# Patient Record
Sex: Female | Born: 1980 | Race: Black or African American | Hispanic: No | Marital: Single | Smoking: Never smoker
Health system: Southern US, Community
[De-identification: ages and names within clinical notes are randomized; demographics above are authoritative.]

## PROBLEM LIST (undated history)

## (undated) DIAGNOSIS — O009 Unspecified ectopic pregnancy without intrauterine pregnancy: Secondary | ICD-10-CM

## (undated) DIAGNOSIS — F419 Anxiety disorder, unspecified: Secondary | ICD-10-CM

## (undated) DIAGNOSIS — Z789 Other specified health status: Secondary | ICD-10-CM

## (undated) DIAGNOSIS — R51 Headache: Secondary | ICD-10-CM

## (undated) DIAGNOSIS — R519 Headache, unspecified: Secondary | ICD-10-CM

## (undated) HISTORY — PX: THERAPEUTIC ABORTION: SHX798

## (undated) HISTORY — PX: DILATION AND EVACUATION: SHX1459

---

## 2002-07-06 ENCOUNTER — Emergency Department (HOSPITAL_COMMUNITY): Admission: EM | Admit: 2002-07-06 | Discharge: 2002-07-06 | Payer: Self-pay | Admitting: Emergency Medicine

## 2003-01-13 ENCOUNTER — Emergency Department (HOSPITAL_COMMUNITY): Admission: EM | Admit: 2003-01-13 | Discharge: 2003-01-13 | Payer: Self-pay | Admitting: Emergency Medicine

## 2003-10-11 ENCOUNTER — Emergency Department (HOSPITAL_COMMUNITY): Admission: EM | Admit: 2003-10-11 | Discharge: 2003-10-11 | Payer: Self-pay | Admitting: Emergency Medicine

## 2004-01-27 ENCOUNTER — Emergency Department (HOSPITAL_COMMUNITY): Admission: EM | Admit: 2004-01-27 | Discharge: 2004-01-27 | Payer: Self-pay | Admitting: Emergency Medicine

## 2004-04-26 ENCOUNTER — Emergency Department (HOSPITAL_COMMUNITY): Admission: EM | Admit: 2004-04-26 | Discharge: 2004-04-26 | Payer: Self-pay | Admitting: Emergency Medicine

## 2004-11-29 ENCOUNTER — Emergency Department (HOSPITAL_COMMUNITY): Admission: EM | Admit: 2004-11-29 | Discharge: 2004-11-29 | Payer: Self-pay | Admitting: Emergency Medicine

## 2004-12-01 ENCOUNTER — Emergency Department (HOSPITAL_COMMUNITY): Admission: EM | Admit: 2004-12-01 | Discharge: 2004-12-01 | Payer: Self-pay | Admitting: Emergency Medicine

## 2005-06-01 ENCOUNTER — Emergency Department (HOSPITAL_COMMUNITY): Admission: EM | Admit: 2005-06-01 | Discharge: 2005-06-01 | Payer: Self-pay | Admitting: Family Medicine

## 2006-05-24 ENCOUNTER — Emergency Department (HOSPITAL_COMMUNITY): Admission: EM | Admit: 2006-05-24 | Discharge: 2006-05-24 | Payer: Self-pay | Admitting: Family Medicine

## 2006-05-27 ENCOUNTER — Encounter: Payer: Self-pay | Admitting: Gynecology

## 2006-05-27 ENCOUNTER — Emergency Department (HOSPITAL_COMMUNITY): Admission: EM | Admit: 2006-05-27 | Discharge: 2006-05-27 | Payer: Self-pay | Admitting: Emergency Medicine

## 2006-05-29 ENCOUNTER — Inpatient Hospital Stay (HOSPITAL_COMMUNITY): Admission: AD | Admit: 2006-05-29 | Discharge: 2006-05-29 | Payer: Self-pay | Admitting: Gynecology

## 2006-06-11 ENCOUNTER — Ambulatory Visit (HOSPITAL_COMMUNITY): Admission: RE | Admit: 2006-06-11 | Discharge: 2006-06-11 | Payer: Self-pay | Admitting: Obstetrics

## 2006-06-16 ENCOUNTER — Inpatient Hospital Stay (HOSPITAL_COMMUNITY): Admission: AD | Admit: 2006-06-16 | Discharge: 2006-06-16 | Payer: Self-pay | Admitting: Obstetrics and Gynecology

## 2006-09-21 ENCOUNTER — Inpatient Hospital Stay (HOSPITAL_COMMUNITY): Admission: AD | Admit: 2006-09-21 | Discharge: 2006-09-21 | Payer: Self-pay | Admitting: Obstetrics

## 2006-09-24 ENCOUNTER — Inpatient Hospital Stay (HOSPITAL_COMMUNITY): Admission: AD | Admit: 2006-09-24 | Discharge: 2006-09-24 | Payer: Self-pay | Admitting: Obstetrics

## 2006-09-25 ENCOUNTER — Inpatient Hospital Stay (HOSPITAL_COMMUNITY): Admission: AD | Admit: 2006-09-25 | Discharge: 2006-09-25 | Payer: Self-pay | Admitting: Obstetrics & Gynecology

## 2007-06-08 ENCOUNTER — Emergency Department (HOSPITAL_COMMUNITY): Admission: EM | Admit: 2007-06-08 | Discharge: 2007-06-08 | Payer: Self-pay | Admitting: Emergency Medicine

## 2007-07-02 ENCOUNTER — Ambulatory Visit (HOSPITAL_COMMUNITY): Admission: RE | Admit: 2007-07-02 | Discharge: 2007-07-02 | Payer: Self-pay | Admitting: Obstetrics and Gynecology

## 2008-06-30 ENCOUNTER — Emergency Department (HOSPITAL_COMMUNITY): Admission: EM | Admit: 2008-06-30 | Discharge: 2008-06-30 | Payer: Self-pay | Admitting: Emergency Medicine

## 2008-07-02 ENCOUNTER — Emergency Department (HOSPITAL_COMMUNITY): Admission: EM | Admit: 2008-07-02 | Discharge: 2008-07-02 | Payer: Self-pay | Admitting: Emergency Medicine

## 2008-12-18 ENCOUNTER — Emergency Department (HOSPITAL_COMMUNITY): Admission: EM | Admit: 2008-12-18 | Discharge: 2008-12-18 | Payer: Self-pay | Admitting: Family Medicine

## 2009-01-26 ENCOUNTER — Emergency Department (HOSPITAL_COMMUNITY): Admission: EM | Admit: 2009-01-26 | Discharge: 2009-01-26 | Payer: Self-pay | Admitting: Family Medicine

## 2009-10-25 ENCOUNTER — Emergency Department (HOSPITAL_COMMUNITY): Admission: EM | Admit: 2009-10-25 | Discharge: 2009-10-25 | Payer: Self-pay | Admitting: Emergency Medicine

## 2010-02-14 ENCOUNTER — Emergency Department (HOSPITAL_COMMUNITY): Admission: EM | Admit: 2010-02-14 | Discharge: 2010-02-14 | Payer: Self-pay | Admitting: Emergency Medicine

## 2010-02-15 ENCOUNTER — Emergency Department (HOSPITAL_COMMUNITY): Admission: EM | Admit: 2010-02-15 | Discharge: 2010-02-15 | Payer: Self-pay | Admitting: Emergency Medicine

## 2010-05-02 ENCOUNTER — Emergency Department (HOSPITAL_COMMUNITY): Admission: EM | Admit: 2010-05-02 | Discharge: 2010-05-02 | Payer: Self-pay | Admitting: Family Medicine

## 2011-03-14 LAB — URINALYSIS, ROUTINE W REFLEX MICROSCOPIC
Protein, ur: NEGATIVE mg/dL
Urobilinogen, UA: 0.2 mg/dL (ref 0.0–1.0)

## 2011-08-16 ENCOUNTER — Inpatient Hospital Stay (INDEPENDENT_AMBULATORY_CARE_PROVIDER_SITE_OTHER)
Admission: RE | Admit: 2011-08-16 | Discharge: 2011-08-16 | Disposition: A | Discharge status: Home / Self Care | Payer: 59 | Source: Ambulatory Visit | Attending: Family Medicine | Admitting: Family Medicine

## 2011-08-16 ENCOUNTER — Ambulatory Visit (INDEPENDENT_AMBULATORY_CARE_PROVIDER_SITE_OTHER): Payer: 59

## 2011-08-16 DIAGNOSIS — S92919A Unspecified fracture of unspecified toe(s), initial encounter for closed fracture: Secondary | ICD-10-CM

## 2011-09-20 LAB — URINALYSIS, ROUTINE W REFLEX MICROSCOPIC
Glucose, UA: NEGATIVE
Hgb urine dipstick: NEGATIVE
Nitrite: NEGATIVE
Specific Gravity, Urine: 1.019
pH: 6

## 2011-09-20 LAB — POCT I-STAT, CHEM 8
Glucose, Bld: 93
HCT: 46
Hemoglobin: 15.6 — ABNORMAL HIGH
Potassium: 3.8
Sodium: 136
TCO2: 21

## 2011-12-03 ENCOUNTER — Encounter: Payer: Self-pay | Admitting: Emergency Medicine

## 2011-12-03 ENCOUNTER — Emergency Department (HOSPITAL_COMMUNITY): Payer: 59

## 2011-12-03 ENCOUNTER — Emergency Department (INDEPENDENT_AMBULATORY_CARE_PROVIDER_SITE_OTHER)
Admission: EM | Admit: 2011-12-03 | Discharge: 2011-12-03 | Disposition: A | Discharge status: Nursing Home (Includes ICF) | Payer: 59 | Source: Home / Self Care

## 2011-12-03 ENCOUNTER — Emergency Department (HOSPITAL_COMMUNITY)
Admission: EM | Admit: 2011-12-03 | Discharge: 2011-12-04 | Disposition: A | Discharge status: Home / Self Care | Payer: 59 | Attending: Emergency Medicine | Admitting: Emergency Medicine

## 2011-12-03 ENCOUNTER — Encounter (HOSPITAL_COMMUNITY): Payer: Self-pay | Admitting: *Deleted

## 2011-12-03 DIAGNOSIS — R197 Diarrhea, unspecified: Secondary | ICD-10-CM | POA: Insufficient documentation

## 2011-12-03 DIAGNOSIS — R109 Unspecified abdominal pain: Secondary | ICD-10-CM | POA: Insufficient documentation

## 2011-12-03 DIAGNOSIS — K5289 Other specified noninfective gastroenteritis and colitis: Secondary | ICD-10-CM | POA: Insufficient documentation

## 2011-12-03 DIAGNOSIS — R112 Nausea with vomiting, unspecified: Secondary | ICD-10-CM | POA: Insufficient documentation

## 2011-12-03 DIAGNOSIS — R1011 Right upper quadrant pain: Secondary | ICD-10-CM

## 2011-12-03 DIAGNOSIS — I880 Nonspecific mesenteric lymphadenitis: Secondary | ICD-10-CM | POA: Insufficient documentation

## 2011-12-03 DIAGNOSIS — K529 Noninfective gastroenteritis and colitis, unspecified: Secondary | ICD-10-CM

## 2011-12-03 LAB — DIFFERENTIAL
Basophils Absolute: 0 10*3/uL (ref 0.0–0.1)
Eosinophils Absolute: 0.2 10*3/uL (ref 0.0–0.7)
Eosinophils Relative: 3 % (ref 0–5)
Lymphocytes Relative: 25 % (ref 12–46)
Neutrophils Relative %: 67 % (ref 43–77)

## 2011-12-03 LAB — POCT URINALYSIS DIP (DEVICE)
Protein, ur: NEGATIVE mg/dL
Specific Gravity, Urine: 1.025 (ref 1.005–1.030)

## 2011-12-03 LAB — COMPREHENSIVE METABOLIC PANEL
CO2: 25 mEq/L (ref 19–32)
Calcium: 9 mg/dL (ref 8.4–10.5)
Creatinine, Ser: 0.63 mg/dL (ref 0.50–1.10)
GFR calc Af Amer: >90 mL/min (ref >90)
GFR calc non Af Amer: >90 mL/min (ref >90)
Glucose, Bld: 80 mg/dL (ref 70–99)

## 2011-12-03 LAB — CBC
MCV: 89.4 fL (ref 78.0–100.0)
WBC: 6.9 10*3/uL (ref 4.0–10.5)

## 2011-12-03 LAB — POCT PREGNANCY, URINE: Preg Test, Ur: NEGATIVE

## 2011-12-03 MED ORDER — MORPHINE SULFATE 4 MG/ML IJ SOLN
4.0000 mg | Freq: Once | INTRAMUSCULAR | Status: AC
  Administered 2011-12-04: 4 mg via INTRAVENOUS
  Filled 2011-12-03: qty 1

## 2011-12-03 MED ORDER — SODIUM CHLORIDE 0.9 % IV BOLUS (SEPSIS)
1000.0000 mL | Freq: Once | INTRAVENOUS | Status: AC
  Administered 2011-12-03: 1000 mL via INTRAVENOUS

## 2011-12-03 MED ORDER — MORPHINE SULFATE 4 MG/ML IJ SOLN
4.0000 mg | Freq: Once | INTRAMUSCULAR | Status: AC
  Administered 2011-12-03: 4 mg via INTRAVENOUS
  Filled 2011-12-03: qty 1

## 2011-12-03 MED ORDER — SODIUM CHLORIDE 0.9 % IV SOLN: Freq: Once | INTRAVENOUS | Status: DC

## 2011-12-03 MED ORDER — ONDANSETRON HCL 4 MG/2ML IJ SOLN
4.0000 mg | Freq: Once | INTRAMUSCULAR | Status: AC
  Administered 2011-12-03: 4 mg via INTRAVENOUS
  Filled 2011-12-03: qty 2

## 2011-12-03 NOTE — ED Provider Notes (Signed)
History     CSN: [id number] Arrival date & time: 12/03/2011  2:03 PM   None     Chief Complaint  Patient presents with  . Abdominal Pain  . Back Pain    (Consider location/radiation/quality/duration/timing/severity/associated sxs/prior treatment) HPI Comments: Onset of RUQ abd pain 2 days ago. Pain is worse with movement. Has decreased appetite, nausea and has vomited. Has vomited 3 times since onset of pain. Loose greenish colored stool 2 nights ago. No fever or chills. Pain radiates to her Rt mid back.   Patient is a 30 y.o. female presenting with abdominal pain. The history is provided by the patient.  Abdominal Pain The primary symptoms of the illness include abdominal pain, fever, nausea, vomiting and diarrhea. The primary symptoms of the illness do not include shortness of breath, dysuria, vaginal discharge or vaginal bleeding. The current episode started 2 days ago. The onset of the illness was sudden.  The abdominal pain began 2 days ago. The pain came on suddenly. The abdominal pain has been unchanged since its onset. The abdominal pain is located in the RUQ. The abdominal pain radiates to the back. The severity of the abdominal pain is 7/10. The abdominal pain is relieved by nothing. The abdominal pain is exacerbated by certain positions.  Nausea began 2 days ago. The nausea is exacerbated by food.  The vomiting began 2 days ago. Vomiting occurs 2 to 5 times per day.  The patient states that she believes she is currently not pregnant. The patient has had a change in bowel habit. Symptoms associated with the illness do not include chills, heartburn, constipation, urgency or frequency.    History reviewed. No pertinent past medical history.  History reviewed. No pertinent past surgical history.  History reviewed. No pertinent family history.  History  Substance Use Topics  . Smoking status: Never Smoker   . Smokeless tobacco: Not on file  . Alcohol Use: No    OB  History    Grav Para Term Preterm Abortions TAB SAB Ect Mult Living                  Review of Systems  Constitutional: Positive for fever. Negative for chills.  Respiratory: Negative for shortness of breath.   Cardiovascular: Negative for chest pain.  Gastrointestinal: Positive for nausea, vomiting, abdominal pain and diarrhea. Negative for heartburn and constipation.  Genitourinary: Negative for dysuria, urgency, frequency, vaginal bleeding and vaginal discharge.    Allergies  Review of patient's allergies indicates no known allergies.  Home Medications   Current Outpatient Rx  Name Route Sig Dispense Refill  . AMITRIPTYLINE HCL 10 MG PO TABS Oral Take 10 mg by mouth at bedtime.      Marland Kitchen ZOLPIDEM TARTRATE 10 MG PO TABS Oral Take 10 mg by mouth at bedtime as needed.        BP 120/81  Pulse 126  Temp(Src) 98.9 F (37.2 C) (Oral)  Resp 20  Wt 49 lb 12 oz (22.566 kg)  SpO2 100%  LMP 11/19/2011  Physical Exam  Nursing note and vitals reviewed. Constitutional: She appears well-developed and well-nourished. No distress.  HENT:  Head: Normocephalic and atraumatic.  Right Ear: Tympanic membrane, external ear and ear canal normal.  Left Ear: Tympanic membrane, external ear and ear canal normal.  Nose: Nose normal.  Mouth/Throat: Uvula is midline, oropharynx is clear and moist and mucous membranes are normal. No oropharyngeal exudate, posterior oropharyngeal edema or posterior oropharyngeal erythema.  Neck: Neck supple.  Cardiovascular: Normal rate, regular rhythm and normal heart sounds.   Pulmonary/Chest: Effort normal and breath sounds normal. No respiratory distress.  Abdominal: Normal appearance and bowel sounds are normal. She exhibits no mass. There is no hepatosplenomegaly. There is tenderness in the right upper quadrant. There is guarding. There is no rebound.  Lymphadenopathy:    She has no cervical adenopathy.  Neurological: She is alert.  Skin: Skin is warm and dry.   Psychiatric: She has a normal mood and affect.    ED Course  Procedures (including critical care time)  Labs Reviewed  POCT URINALYSIS DIP (DEVICE) - Abnormal; Notable for the following:    Bilirubin Urine SMALL (*)    Urobilinogen, UA 4.0 (*)    Leukocytes, UA TRACE (*) Biochemical Testing Only. Please order routine urinalysis from main lab if confirmatory testing is needed.   All other components within normal limits  POCT PREGNANCY, URINE  POCT URINALYSIS DIPSTICK  POCT PREGNANCY, URINE   No results found.   No diagnosis found.    MDM          Melody Comas, PA 12/03/11 [phone]

## 2011-12-03 NOTE — ED Notes (Signed)
The pt was sent here from [facility] with epigastric pain for 3 days.  Vomiting for 3 days

## 2011-12-03 NOTE — ED Provider Notes (Signed)
8:00 PM Pt in the CDU, care transferred from Dr Lynelle Doctor. She has had right-sided abdominal pain for 3 days with nausea and vomiting for the first 2 days. There's been no fever or chills. She was sent to the emergency department from urgent care with a suspicion for cholecystitis she is awaiting an abdominal ultrasound. She reports improved pain after medication and on exam has moderate tenderness to palpation of the entire abdomen, worse on the right side.   US Abdomen Complete  12/03/2011  *RADIOLOGY REPORT*  Clinical Data: Right upper quadrant pain  ABDOMEN ULTRASOUND  Technique:  Complete abdominal ultrasound examination was performed including evaluation of the liver, gallbladder, bile ducts, pancreas, kidneys, spleen, IVC, and abdominal aorta.  Comparison: No comparison studies available.  Findings:  Gallbladder:  There is no evidence for gallstones.  No gallbladder wall thickening or pericholecystic fluid.  The sonographer reports no sonographic Murphy's sign.  Common Bile Duct:  Nondilated at 3 mm diameter.  Liver:  Normal.  No focal parenchymal abnormality.  No biliary dilation.  IVC:  Normal.  Pancreas:  Normal.  Spleen:  Normal.  Right kidney:  10.9 cm in long axis.  Normal.  Left kidney:  11.0 cm in long axis.  Normal.  Abdominal Aorta:  No aneurysm.  IMPRESSION: Normal abdominal ultrasound.  Original Report Authenticated By: ERIC A. MANSELL, M.D.      11:15 PM I have discussed with the patient the results of her ultrasound, which is negative for any acute findings. Her labs show no significant abnormalities. However, she does complain of significant continued pain to the right side of her abdomen, which she describes as sharp and intermittent. We discussed the possibility of appendicitis or colitis causing her symptoms and she is agreeable with remaining in the emergency department for continued testing that will include a CT of the abdomen and pelvis with contrast. I have ordered her  additional pain medication.   11:48 PM I have discussed the patient's plan of care with Dr. Norlene Campbell who will assume care overnight.  Elwyn Reach [place], [place] 12/03/11 [phone]

## 2011-12-03 NOTE — ED Notes (Signed)
Pt to Ultrasound at this time.

## 2011-12-03 NOTE — ED Notes (Signed)
Pt here with right upper quad abd pain radiating around side and right back that started x 3 dys ago with vomiting and poor appetite.last emesis last night after dinner.no fever or cold sx reported.lmp 11/19/11.pt tried prescribed muscle relaxers by pcp metaxalone but no improvement.sx constant sharp,achy pain

## 2011-12-03 NOTE — ED Provider Notes (Addendum)
History     CSN: [id number] Arrival date & time: 12/03/2011  5:16 PM   First MD Initiated Contact with Patient 12/03/11 1844      Chief Complaint  Patient presents with  . Abdominal Pain    (Consider location/radiation/quality/duration/timing/severity/associated sxs/prior treatment) HPI  Patient relates she started having right upper quadrant constant abdominal pain 3 days ago. She's had nausea and vomiting, stating her last episode of vomiting was last night about 10 PM. She had diarrhea 2 nights ago but not today. She states the pains in her right upper quadrant and radiates into her right back. She denies feeling dizzy or weak on standing. She states laying down or movement or deep breathing makes the pain worse. Nothing makes it feel better. She relates she thought at first it was a sore muscle but Robaxin has not been helping. She states the pain is sharp.  Primary care physician Dr. Concepcion Elk  History reviewed. No pertinent past medical history. Migraines  History reviewed. No pertinent past surgical history.  No family history on file.  No family history gallstones  History  Substance Use Topics  . Smoking status: Never Smoker   . Smokeless tobacco: Not on file  . Alcohol Use: No   patient employed  OB History    Grav Para Term Preterm Abortions TAB SAB Ect Mult Living                  Review of Systems  All other systems reviewed and are negative.    Allergies  Penicillins  Home Medications   Current Outpatient Rx  Name Route Sig Dispense Refill  . AMITRIPTYLINE HCL 10 MG PO TABS Oral Take 10 mg by mouth at bedtime.     Marland Kitchen METAXALONE 800 MG PO TABS Oral Take 800 mg by mouth 3 (three) times daily. For pain     . ZOLPIDEM TARTRATE 10 MG PO TABS Oral Take 10 mg by mouth at bedtime as needed.       BP 114/70  Pulse 87  Temp(Src) 98.2 F (36.8 C) (Oral)  Resp 18  SpO2 100%  LMP 11/19/2011 Vital signs normal  Physical Exam  Nursing note and vitals  reviewed. Constitutional: She is oriented to person, place, and time. She appears well-developed and well-nourished.  Non-toxic appearance. She does not appear ill. No distress.       Patient upset about her wait  HENT:  Head: Normocephalic and atraumatic.  Right Ear: External ear normal.  Left Ear: External ear normal.  Nose: Nose normal. No mucosal edema or rhinorrhea.  Mouth/Throat: Oropharynx is clear and moist and mucous membranes are normal. No dental abscesses or uvula swelling.  Eyes: Conjunctivae and EOM are normal. Pupils are equal, round, and reactive to light.  Neck: Normal range of motion and full passive range of motion without pain. Neck supple.  Cardiovascular: Normal rate, regular rhythm and normal heart sounds.  Exam reveals no gallop and no friction rub.   No murmur heard. Pulmonary/Chest: Effort normal and breath sounds normal. No respiratory distress. She has no wheezes. She has no rhonchi. She has no rales. She exhibits no tenderness and no crepitus.  Abdominal: Soft. Normal appearance and bowel sounds are normal. She exhibits no distension. There is tenderness. There is no rebound and no guarding.       Patient's tender in the right upper quadrant which is also tender in the right lower quadrant. There is no guarding or rebound. There is no pain  to tapping the right knee. There is also no psoas sign  Musculoskeletal: Normal range of motion. She exhibits no edema and no tenderness.       Moves all extremities well.   Neurological: She is alert and oriented to person, place, and time. She has normal strength. No cranial nerve deficit.  Skin: Skin is warm, dry and intact. No rash noted. No erythema. No pallor.  Psychiatric: Her speech is normal and behavior is normal. Her mood appears not anxious.       Affect is flat    ED Course  Procedures (including critical care time)  Patient started on IV fluids IV pain and nausea medicine  1918 Discussed with  Lorenz Coaster,  PA will send to CDU to get her labs and ultrasound done, if negative may need CT scan to r/o appy or colitis  Results for orders placed during the hospital encounter of 12/03/11  CBC      Component Value Range   WBC 6.9  4.0 - 10.5 (K/uL)   RBC 4.54  3.87 - 5.11 (MIL/uL)   Hemoglobin 14.1  12.0 - 15.0 (g/dL)   HCT 16.1  09.6 - 04.5 (%)   MCV 89.4  78.0 - 100.0 (fL)   MCH 31.1  26.0 - 34.0 (pg)   MCHC 34.7  30.0 - 36.0 (g/dL)   RDW 40.9  81.1 - 91.4 (%)   Platelets 236  150 - 400 (K/uL)  DIFFERENTIAL      Component Value Range   Neutrophils Relative 67  43 - 77 (%)   Neutro Abs 4.6  1.7 - 7.7 (K/uL)   Lymphocytes Relative 25  12 - 46 (%)   Lymphs Abs 1.7  0.7 - 4.0 (K/uL)   Monocytes Relative 5  3 - 12 (%)   Monocytes Absolute 0.4  0.1 - 1.0 (K/uL)   Eosinophils Relative 3  0 - 5 (%)   Eosinophils Absolute 0.2  0.0 - 0.7 (K/uL)   Basophils Relative 0  0 - 1 (%)   Basophils Absolute 0.0  0.0 - 0.1 (K/uL)  COMPREHENSIVE METABOLIC PANEL      Component Value Range   Sodium 139  135 - 145 (mEq/L)   Potassium 3.5  3.5 - 5.1 (mEq/L)   Chloride 103  96 - 112 (mEq/L)   CO2 25  19 - 32 (mEq/L)   Glucose, Bld 80  70 - 99 (mg/dL)   BUN 11  6 - 23 (mg/dL)   Creatinine, Ser 7.82  0.50 - 1.10 (mg/dL)   Calcium 9.0  8.4 - 95.6 (mg/dL)   Total Protein 7.0  6.0 - 8.3 (g/dL)   Albumin 3.4 (*) 3.5 - 5.2 (g/dL)   AST 11  0 - 37 (U/L)   ALT 8  0 - 35 (U/L)   Alkaline Phosphatase 57  39 - 117 (U/L)   Total Bilirubin 0.4  0.3 - 1.2 (mg/dL)   GFR calc non Af Amer >90  >90 (mL/min)   GFR calc Af Amer >90  >90 (mL/min)  LIPASE, BLOOD      Component Value Range   Lipase 24  11 - 59 (U/L)   .Laboratory Interpretation normal  US abdomen pending  Initial Impression Abdominal pain vomiting  MDM          Ward Givens, MD 12/03/11 2201  Patient seen at change of shift after stay in CDU. Negative ultrasound and CT scan aside from mesenteric adenitis and possible gastroenteritis.  Patient  updated on findings. Will send home with pain and nausea medicine. She is happy with this plan  Olivia Mackie, MD 12/04/11 [phone]

## 2011-12-04 ENCOUNTER — Encounter (HOSPITAL_COMMUNITY): Payer: Self-pay | Admitting: Radiology

## 2011-12-04 ENCOUNTER — Emergency Department (HOSPITAL_COMMUNITY): Payer: 59

## 2011-12-04 MED ORDER — IOHEXOL 300 MG/ML  SOLN
40.0000 mL | Freq: Once | INTRAMUSCULAR | Status: AC | PRN
  Administered 2011-12-03: 40 mL via ORAL

## 2011-12-04 MED ORDER — PROMETHAZINE HCL 25 MG PO TABS: 25.0000 mg | ORAL_TABLET | Freq: Four times a day (QID) | ORAL | Status: AC | PRN

## 2011-12-04 MED ORDER — PROMETHAZINE HCL 25 MG RE SUPP: 25.0000 mg | Freq: Four times a day (QID) | RECTAL | Status: AC | PRN

## 2011-12-04 MED ORDER — IOHEXOL 300 MG/ML  SOLN
100.0000 mL | Freq: Once | INTRAMUSCULAR | Status: AC | PRN
  Administered 2011-12-04: 100 mL via INTRAVENOUS

## 2011-12-04 MED ORDER — ONDANSETRON HCL 4 MG PO TABS: 4.0000 mg | ORAL_TABLET | Freq: Four times a day (QID) | ORAL | Status: AC

## 2011-12-04 MED ORDER — OXYCODONE-ACETAMINOPHEN 5-325 MG PO TABS: 2.0000 | ORAL_TABLET | ORAL | Status: AC | PRN

## 2011-12-04 NOTE — ED Notes (Signed)
Pt to CT at this time.

## 2011-12-04 NOTE — ED Notes (Signed)
Pt finished drinking contrast, CT aware.  Pt resting with eyes closed.

## 2011-12-04 NOTE — ED Notes (Signed)
Pt given pain med.  Drinking contrast for CT.

## 2011-12-04 NOTE — ED Provider Notes (Signed)
See CDU note  Ward Givens, MD 12/04/11 2211

## 2011-12-04 NOTE — ED Provider Notes (Signed)
History     CSN: [id number] Arrival date & time: 12/03/2011  5:16 PM   First MD Initiated Contact with Patient 12/03/11 1844      Chief Complaint  Patient presents with  . Abdominal Pain    (Consider location/radiation/quality/duration/timing/severity/associated sxs/prior treatment) HPI  History reviewed. No pertinent past medical history.  History reviewed. No pertinent past surgical history.  No family history on file.  History  Substance Use Topics  . Smoking status: Never Smoker   . Smokeless tobacco: Not on file  . Alcohol Use: No    OB History    Grav Para Term Preterm Abortions TAB SAB Ect Mult Living                  Review of Systems  Allergies  Penicillins  Home Medications   Current Outpatient Rx  Name Route Sig Dispense Refill  . AMITRIPTYLINE HCL 10 MG PO TABS Oral Take 10 mg by mouth at bedtime.     Marland Kitchen METAXALONE 800 MG PO TABS Oral Take 800 mg by mouth 3 (three) times daily. For pain     . ZOLPIDEM TARTRATE 10 MG PO TABS Oral Take 10 mg by mouth at bedtime as needed.     Marland Kitchen ONDANSETRON HCL 4 MG PO TABS Oral Take 1 tablet (4 mg total) by mouth every 6 (six) hours. 12 tablet 0  . OXYCODONE-ACETAMINOPHEN 5-325 MG PO TABS Oral Take 2 tablets by mouth every 4 (four) hours as needed for pain. 20 tablet 0  . PROMETHAZINE HCL 25 MG RE SUPP Rectal Place 1 suppository (25 mg total) rectally every 6 (six) hours as needed for nausea. 12 each 0  . PROMETHAZINE HCL 25 MG PO TABS Oral Take 1 tablet (25 mg total) by mouth every 6 (six) hours as needed for nausea. 30 tablet 0    BP 114/70  Pulse 87  Temp(Src) 98.2 F (36.8 C) (Oral)  Resp 18  SpO2 100%  LMP 11/19/2011  Physical Exam  ED Course  Procedures (including critical care time)  Labs Reviewed  COMPREHENSIVE METABOLIC PANEL - Abnormal; Notable for the following:    Albumin 3.4 (*)    All other components within normal limits  CBC  DIFFERENTIAL  LIPASE, BLOOD   US Abdomen  Complete  12/03/2011  *RADIOLOGY REPORT*  Clinical Data: Right upper quadrant pain  ABDOMEN ULTRASOUND  Technique:  Complete abdominal ultrasound examination was performed including evaluation of the liver, gallbladder, bile ducts, pancreas, kidneys, spleen, IVC, and abdominal aorta.  Comparison: No comparison studies available.  Findings:  Gallbladder:  There is no evidence for gallstones.  No gallbladder wall thickening or pericholecystic fluid.  The sonographer reports no sonographic Murphy's sign.  Common Bile Duct:  Nondilated at 3 mm diameter.  Liver:  Normal.  No focal parenchymal abnormality.  No biliary dilation.  IVC:  Normal.  Pancreas:  Normal.  Spleen:  Normal.  Right kidney:  10.9 cm in long axis.  Normal.  Left kidney:  11.0 cm in long axis.  Normal.  Abdominal Aorta:  No aneurysm.  IMPRESSION: Normal abdominal ultrasound.  Original Report Authenticated By: ERIC A. MANSELL, M.D.   Ct Abdomen Pelvis W Contrast  12/04/2011  *RADIOLOGY REPORT*  Clinical Data: Right-sided abdominal pain, nausea, vomiting, diarrhea  CT ABDOMEN AND PELVIS WITH CONTRAST  Technique:  Multidetector CT imaging of the abdomen and pelvis was performed following the standard protocol during bolus administration of intravenous contrast.  Contrast: OMNIPAQUE IOHEXOL 300 MG/ML  IV SOLN  Comparison: None.  Findings: Small right pleural effusion with mild atelectasis in the right lung base.  The liver, spleen, gallbladder, pancreas, adrenal glands, kidneys, abdominal aorta, and retroperitoneal lymph nodes are unremarkable.  Diffusely prominent mesenteric lymph nodes without pathologic enlargement, likely reactive.  The stomach is decompressed.  Normal caliber small bowel with contrast material extending to the colon suggesting no evidence of obstruction. There is wall thickening and fold thickening of the distal small bowel suggesting gastroenteritis or inflammatory bowel disease.  No free fluid or free air in the abdomen.   Diffusely stool filled colon without wall thickening or distension.  Pelvis:  Uterus and adnexal structures are not significantly enlarged.  Suggestion of a cyst in the right ovary.  No free or loculated pelvic fluid collections.  The bladder wall is not thickened.  The appendix is not specifically demonstrated.  . Normal alignment of the lumbar vertebrae.  IMPRESSION: Diffuse thickening of the wall and folds in the distal small bowel with reactive mesenteric lymphadenopathy.  Changes could represent gastroenteritis or inflammatory bowel disease.  Appendix is not visualized but no specific inflammatory changes are demonstrated in the right lower quadrant.  Original Report Authenticated By: Marlon Pel, M.D.     1. Mesenteric adenitis   2. Abdominal pain   3. Gastroenteritis       MDM  Abdominal pain        Olivia Mackie, MD 12/04/11 [phone]

## 2011-12-05 NOTE — ED Provider Notes (Signed)
Medical screening examination/treatment/procedure(s) were performed by non-physician practitioner and as supervising physician I was immediately available for consultation/collaboration.   Barkley Bruns MD.    Barkley Bruns, MD 12/05/11 2135

## 2012-10-07 ENCOUNTER — Other Ambulatory Visit: Payer: Self-pay | Admitting: Internal Medicine

## 2012-10-07 DIAGNOSIS — G43909 Migraine, unspecified, not intractable, without status migrainosus: Secondary | ICD-10-CM

## 2012-10-10 ENCOUNTER — Other Ambulatory Visit: Payer: 59

## 2012-10-13 ENCOUNTER — Other Ambulatory Visit: Payer: 59

## 2013-01-02 ENCOUNTER — Emergency Department (INDEPENDENT_AMBULATORY_CARE_PROVIDER_SITE_OTHER): Payer: 59

## 2013-01-02 ENCOUNTER — Encounter (HOSPITAL_COMMUNITY): Payer: Self-pay | Admitting: Emergency Medicine

## 2013-01-02 ENCOUNTER — Emergency Department (INDEPENDENT_AMBULATORY_CARE_PROVIDER_SITE_OTHER)
Admission: EM | Admit: 2013-01-02 | Discharge: 2013-01-02 | Disposition: A | Discharge status: Home / Self Care | Payer: 59 | Source: Home / Self Care | Attending: Family Medicine | Admitting: Family Medicine

## 2013-01-02 DIAGNOSIS — R071 Chest pain on breathing: Secondary | ICD-10-CM

## 2013-01-02 DIAGNOSIS — R0789 Other chest pain: Secondary | ICD-10-CM

## 2013-01-02 MED ORDER — DICLOFENAC 18 MG PO CAPS: 18.0000 mg | ORAL_CAPSULE | Freq: Three times a day (TID) | ORAL | Status: DC

## 2013-01-02 NOTE — ED Notes (Addendum)
Was asked by front staff to evaluate pt's chest pain Pt c/o chest pain x2 months and it increases w/activity/pressure and when she takes deep breaths Describes pain as constant and tight on right side Denies: inj/trauma, prior cold sx, blurry vision, edema, nauseas, diarrhea, fever  She is alert w/no signs of acute distress; pt asked to notify front staff if sx change while she waits

## 2013-01-02 NOTE — ED Provider Notes (Signed)
History     CSN: [id number]  Arrival date & time 01/02/13  1254   First MD Initiated Contact with Patient 01/02/13 1258      Chief Complaint  Patient presents with  . Chest Pain    (Consider location/radiation/quality/duration/timing/severity/associated sxs/prior treatment) Patient is a 32 y.o. female presenting with chest pain. The history is provided by the patient.  Chest Pain The chest pain began yesterday. The chest pain is unchanged. The severity of the pain is mild. The quality of the pain is described as sharp. The pain radiates to the right shoulder. Chest pain is worsened by certain positions. Pertinent negatives for primary symptoms include no fever, no shortness of breath, no cough and no palpitations.     History reviewed. No pertinent past medical history.  History reviewed. No pertinent past surgical history.  No family history on file.  History  Substance Use Topics  . Smoking status: Never Smoker   . Smokeless tobacco: Not on file  . Alcohol Use: No    OB History    Grav Para Term Preterm Abortions TAB SAB Ect Mult Living                  Review of Systems  Constitutional: Negative.  Negative for fever.  HENT: Negative.   Respiratory: Negative for cough, chest tightness and shortness of breath.   Cardiovascular: Positive for chest pain. Negative for palpitations.  Gastrointestinal: Negative.     Allergies  Penicillins  Home Medications   Current Outpatient Rx  Name  Route  Sig  Dispense  Refill  . AMITRIPTYLINE HCL 10 MG PO TABS   Oral   Take 10 mg by mouth at bedtime.          Marland Kitchen ZOLPIDEM TARTRATE 10 MG PO TABS   Oral   Take 10 mg by mouth at bedtime as needed.          Marland Kitchen DICLOFENAC 18 MG PO CAPS   Oral   Take 18 mg by mouth 3 (three) times daily after meals. As needed for pain.   30 capsule   1   . METAXALONE 800 MG PO TABS   Oral   Take 800 mg by mouth 3 (three) times daily. For pain            BP 124/63  Pulse 100   Temp 98.3 F (36.8 C)  Resp 18  SpO2 100%  LMP 12/07/2012  Physical Exam  Nursing note and vitals reviewed. Constitutional: She is oriented to person, place, and time. She appears well-developed and well-nourished.  HENT:  Head: Normocephalic.  Mouth/Throat: Oropharynx is clear and moist.  Neck: Normal range of motion. Neck supple.  Pulmonary/Chest: Effort normal and breath sounds normal. She exhibits tenderness.    Lymphadenopathy:    She has no cervical adenopathy.  Neurological: She is alert and oriented to person, place, and time.  Skin: Skin is warm and dry.  Psychiatric: She has a normal mood and affect.    ED Course  Procedures (including critical care time)  Labs Reviewed - No data to display Dg Chest 2 View  01/02/2013  *RADIOLOGY REPORT*  Clinical Data: Right-sided chest pain.  CHEST - 2 VIEW  Comparison: Chest x-ray 10/25/2009.  Findings: Lung volumes are normal.  No consolidative airspace disease.  No pleural effusions.  No pneumothorax.  No pulmonary nodule or mass noted.  Pulmonary vasculature and the cardiomediastinal silhouette are within normal limits.  IMPRESSION: 1. No radiographic evidence  of acute cardiopulmonary disease.   Original Report Authenticated By: Trudie Reed, M.D.      1. Acute chest wall pain       MDM  X-rays reviewed and report per radiologist.       Linna Hoff, MD 01/02/13 [phone]

## 2013-01-02 NOTE — ED Notes (Signed)
Pt now states her chest pains began yesterday night.

## 2014-03-07 ENCOUNTER — Emergency Department (INDEPENDENT_AMBULATORY_CARE_PROVIDER_SITE_OTHER)
Admission: EM | Admit: 2014-03-07 | Discharge: 2014-03-07 | Disposition: A | Discharge status: Home / Self Care | Payer: 59 | Source: Home / Self Care | Attending: Family Medicine | Admitting: Family Medicine

## 2014-03-07 ENCOUNTER — Encounter (HOSPITAL_COMMUNITY): Payer: Self-pay | Admitting: Emergency Medicine

## 2014-03-07 DIAGNOSIS — R519 Headache, unspecified: Secondary | ICD-10-CM

## 2014-03-07 DIAGNOSIS — R51 Headache: Secondary | ICD-10-CM

## 2014-03-07 MED ORDER — BUTALBITAL-APAP-CAFFEINE 50-325-40 MG PO TABS: 1.0000 | ORAL_TABLET | Freq: Four times a day (QID) | ORAL | Status: DC | PRN

## 2014-03-07 MED ORDER — KETOROLAC TROMETHAMINE 60 MG/2ML IM SOLN
60.0000 mg | Freq: Once | INTRAMUSCULAR | Status: AC
  Administered 2014-03-07: 60 mg via INTRAMUSCULAR

## 2014-03-07 MED ORDER — KETOROLAC TROMETHAMINE 60 MG/2ML IM SOLN
INTRAMUSCULAR | Status: AC
  Filled 2014-03-07: qty 2

## 2014-03-07 NOTE — ED Notes (Signed)
C/o migraine headache with dizziness  X 3 days.  No relief with otc meds or amitriptyline.   Denies n/v

## 2014-03-07 NOTE — ED Provider Notes (Signed)
CSN: [id number]     Arrival date & time 03/07/14  1522 History   First MD Initiated Contact with Patient 03/07/14 1703     Chief Complaint  Patient presents with  . Migraine   (Consider location/radiation/quality/duration/timing/severity/associated sxs/prior Treatment) HPI Comments: 33 year old female presents complaining of "migraine headache for 3 days." She feels a headache that starts in the back of her head and neck and radiates up to the back of her head. she describes pain as 10 out of 10 in severity. She has also felt slightly lightheaded with this. Denies any injury. No fever, chills, NVD, blurry vision, extremity weakness. This is exactly the same as previous migraine headaches except for it is not going away this time.   History reviewed. No pertinent past medical history. History reviewed. No pertinent past surgical history. History reviewed. No pertinent family history. History  Substance Use Topics  . Smoking status: Never Smoker   . Smokeless tobacco: Not on file  . Alcohol Use: No   OB History   Grav Para Term Preterm Abortions TAB SAB Ect Mult Living                 Review of Systems  Constitutional: Negative for fever and chills.  Eyes: Negative for visual disturbance.  Respiratory: Negative for cough and shortness of breath.   Cardiovascular: Negative for chest pain, palpitations and leg swelling.  Gastrointestinal: Negative for nausea, vomiting and abdominal pain.  Endocrine: Negative for polydipsia and polyuria.  Genitourinary: Negative for dysuria, urgency and frequency.  Musculoskeletal: Negative for arthralgias and myalgias.  Skin: Negative for rash.  Neurological: Positive for light-headedness and headaches. Negative for dizziness and weakness.    Allergies  Penicillins  Home Medications   Current Outpatient Rx  Name  Route  Sig  Dispense  Refill  . amitriptyline (ELAVIL) 10 MG tablet   Oral   Take 10 mg by mouth at bedtime.          .  butalbital-acetaminophen-caffeine (FIORICET) 50-325-40 MG per tablet   Oral   Take 1-2 tablets by mouth every 6 (six) hours as needed for headache.   20 tablet   0   . Diclofenac (ZORVOLEX) 18 MG CAPS   Oral   Take 18 mg by mouth 3 (three) times daily after meals. As needed for pain.   30 capsule   1   . metaxalone (SKELAXIN) 800 MG tablet   Oral   Take 800 mg by mouth 3 (three) times daily. For pain          . zolpidem (AMBIEN) 10 MG tablet   Oral   Take 10 mg by mouth at bedtime as needed.           BP 124/78  Pulse 75  Temp(Src) 98.4 F (36.9 C) (Oral)  Resp 18  SpO2 100%  LMP 02/22/2014 Physical Exam  Nursing note and vitals reviewed. Constitutional: She is oriented to person, place, and time. Vital signs are normal. She appears well-developed and well-nourished. No distress.  HENT:  Head: Normocephalic and atraumatic.  Eyes: EOM are normal. Pupils are equal, round, and reactive to light.  Neck: Normal range of motion and full passive range of motion without pain. Neck supple.  Cardiovascular: Normal rate, regular rhythm and normal heart sounds.  Exam reveals no gallop and no friction rub.   No murmur heard. Pulmonary/Chest: Effort normal and breath sounds normal. No respiratory distress. She has no wheezes. She has no rales.  Lymphadenopathy:  She has no cervical adenopathy.  Neurological: She is alert and oriented to person, place, and time. She has normal strength and normal reflexes. No cranial nerve deficit or sensory deficit. She exhibits normal muscle tone. Coordination and gait normal. GCS eye subscore is 4. GCS verbal subscore is 5. GCS motor subscore is 6.  Skin: Skin is warm and dry. No rash noted. She is not diaphoretic.  Psychiatric: She has a normal mood and affect. Judgment normal.    ED Course  Procedures (including critical care time) Labs Review Labs Reviewed - No data to display Imaging Review No results found.   MDM   1. Headache     Patient describes 10 out of 10 pain, but is in no distress, smiling, laughing. Physical exam is completely normal. We'll give Toradol here and discharge with Fioricet. She will followup with her regular doctor.   Meds ordered this encounter  Medications  . ketorolac (TORADOL) injection 60 mg    Sig:   . butalbital-acetaminophen-caffeine (FIORICET) 50-325-40 MG per tablet    Sig: Take 1-2 tablets by mouth every 6 (six) hours as needed for headache.    Dispense:  20 tablet    Refill:  0    Order Specific Question:  Supervising Provider    Answer:  Bradd CanaryKINDL, JAMES D [[id number]]      Graylon GoodZachary H Kipton Skillen, PA-C 03/08/14 [id number]

## 2014-03-09 NOTE — ED Provider Notes (Signed)
Medical screening examination/treatment/procedure(s) were performed by resident physician or non-physician practitioner and as supervising physician I was immediately available for consultation/collaboration.   Royetta Probus DOUGLAS MD.   Vitali Seibert D Sriyan Cutting, MD 03/09/14 0803 

## 2014-11-10 ENCOUNTER — Other Ambulatory Visit: Payer: Self-pay | Admitting: Family

## 2014-11-10 DIAGNOSIS — N920 Excessive and frequent menstruation with regular cycle: Secondary | ICD-10-CM

## 2014-11-12 ENCOUNTER — Ambulatory Visit
Admission: RE | Admit: 2014-11-12 | Discharge: 2014-11-12 | Disposition: A | Discharge status: Home / Self Care | Payer: 59 | Source: Ambulatory Visit | Attending: Family | Admitting: Family

## 2014-11-12 DIAGNOSIS — N920 Excessive and frequent menstruation with regular cycle: Secondary | ICD-10-CM

## 2014-12-24 ENCOUNTER — Emergency Department (HOSPITAL_COMMUNITY)
Admission: EM | Admit: 2014-12-24 | Discharge: 2014-12-24 | Disposition: A | Discharge status: Home / Self Care | Payer: 59 | Attending: Emergency Medicine | Admitting: Emergency Medicine

## 2014-12-24 ENCOUNTER — Encounter (HOSPITAL_COMMUNITY): Payer: Self-pay | Admitting: Emergency Medicine

## 2014-12-24 ENCOUNTER — Emergency Department (HOSPITAL_COMMUNITY): Payer: 59

## 2014-12-24 DIAGNOSIS — Z880 Allergy status to penicillin: Secondary | ICD-10-CM | POA: Insufficient documentation

## 2014-12-24 DIAGNOSIS — R079 Chest pain, unspecified: Secondary | ICD-10-CM

## 2014-12-24 DIAGNOSIS — M791 Myalgia: Secondary | ICD-10-CM | POA: Insufficient documentation

## 2014-12-24 DIAGNOSIS — Z79899 Other long term (current) drug therapy: Secondary | ICD-10-CM | POA: Diagnosis not present

## 2014-12-24 DIAGNOSIS — N644 Mastodynia: Secondary | ICD-10-CM | POA: Insufficient documentation

## 2014-12-24 DIAGNOSIS — R0602 Shortness of breath: Secondary | ICD-10-CM

## 2014-12-24 DIAGNOSIS — R0789 Other chest pain: Secondary | ICD-10-CM

## 2014-12-24 DIAGNOSIS — R112 Nausea with vomiting, unspecified: Secondary | ICD-10-CM | POA: Insufficient documentation

## 2014-12-24 DIAGNOSIS — M7918 Myalgia, other site: Secondary | ICD-10-CM

## 2014-12-24 LAB — BASIC METABOLIC PANEL
Anion gap: 11 (ref 5–15)
BUN: 7 mg/dL (ref 6–23)
CO2: 19 mmol/L (ref 19–32)
CREATININE: 0.78 mg/dL (ref 0.50–1.10)
Calcium: 9.3 mg/dL (ref 8.4–10.5)
Chloride: 106 mEq/L (ref 96–112)
GLUCOSE: 113 mg/dL — AB (ref 70–99)
Potassium: 3.5 mmol/L (ref 3.5–5.1)
Sodium: 136 mmol/L (ref 135–145)

## 2014-12-24 LAB — CBC
HEMATOCRIT: 39.9 % (ref 36.0–46.0)
HEMOGLOBIN: 14 g/dL (ref 12.0–15.0)
MCH: 32 pg (ref 26.0–34.0)
MCHC: 35.1 g/dL (ref 30.0–36.0)
MCV: 91.1 fL (ref 78.0–100.0)
Platelets: 159 10*3/uL (ref 150–400)
RBC: 4.38 MIL/uL (ref 3.87–5.11)
RDW: 12.6 % (ref 11.5–15.5)
WBC: 4.6 10*3/uL (ref 4.0–10.5)

## 2014-12-24 LAB — BRAIN NATRIURETIC PEPTIDE: B NATRIURETIC PEPTIDE 5: 4 pg/mL (ref 0.0–100.0)

## 2014-12-24 LAB — I-STAT TROPONIN, ED: Troponin i, poc: 0 ng/mL (ref 0.00–0.08)

## 2014-12-24 MED ORDER — NAPROXEN 250 MG PO TABS
500.0000 mg | ORAL_TABLET | Freq: Once | ORAL | Status: AC
  Administered 2014-12-24: 500 mg via ORAL
  Filled 2014-12-24: qty 2

## 2014-12-24 MED ORDER — HYDROCODONE-ACETAMINOPHEN 5-325 MG PO TABS: 1.0000 | ORAL_TABLET | ORAL | Status: DC | PRN

## 2014-12-24 MED ORDER — KETOROLAC TROMETHAMINE 30 MG/ML IJ SOLN
30.0000 mg | Freq: Once | INTRAMUSCULAR | Status: AC
  Administered 2014-12-24: 30 mg via INTRAVENOUS
  Filled 2014-12-24: qty 1

## 2014-12-24 MED ORDER — NAPROXEN 500 MG PO TABS: 500.0000 mg | ORAL_TABLET | Freq: Two times a day (BID) | ORAL | Status: DC

## 2014-12-24 MED ORDER — ONDANSETRON 4 MG PO TBDP
8.0000 mg | ORAL_TABLET | Freq: Once | ORAL | Status: AC
  Administered 2014-12-24: 8 mg via ORAL
  Filled 2014-12-24: qty 2

## 2014-12-24 MED ORDER — ONDANSETRON HCL 4 MG/2ML IJ SOLN
4.0000 mg | Freq: Once | INTRAMUSCULAR | Status: AC
  Administered 2014-12-24: 4 mg via INTRAVENOUS
  Filled 2014-12-24: qty 2

## 2014-12-24 MED ORDER — ONDANSETRON 8 MG PO TBDP: 8.0000 mg | ORAL_TABLET | Freq: Three times a day (TID) | ORAL | Status: DC | PRN

## 2014-12-24 MED ORDER — TRAMADOL HCL 50 MG PO TABS
50.0000 mg | ORAL_TABLET | Freq: Once | ORAL | Status: AC
  Administered 2014-12-24: 50 mg via ORAL
  Filled 2014-12-24: qty 1

## 2014-12-24 NOTE — Discharge Instructions (Signed)
Chest Pain (Nonspecific) °It is often hard to give a specific diagnosis for the cause of chest pain. There is always a chance that your pain could be related to something serious, such as a heart attack or a blood clot in the lungs. You need to follow up with your health care provider for further evaluation. °CAUSES  °· Heartburn. °· Pneumonia or bronchitis. °· Anxiety or stress. °· Inflammation around your heart (pericarditis) or lung (pleuritis or pleurisy). °· A blood clot in the lung. °· A collapsed lung (pneumothorax). It can develop suddenly on its own (spontaneous pneumothorax) or from trauma to the chest. °· Shingles infection (herpes zoster virus). °The chest wall is composed of bones, muscles, and cartilage. Any of these can be the source of the pain. °· The bones can be bruised by injury. °· The muscles or cartilage can be strained by coughing or overwork. °· The cartilage can be affected by inflammation and become sore (costochondritis). °DIAGNOSIS  °Lab tests or other studies may be needed to find the cause of your pain. Your health care provider may have you take a test called an ambulatory electrocardiogram (ECG). An ECG records your heartbeat patterns over a 24-hour period. You may also have other tests, such as: °· Transthoracic echocardiogram (TTE). During echocardiography, sound waves are used to evaluate how blood flows through your heart. °· Transesophageal echocardiogram (TEE). °· Cardiac monitoring. This allows your health care provider to monitor your heart rate and rhythm in real time. °· Holter monitor. This is a portable device that records your heartbeat and can help diagnose heart arrhythmias. It allows your health care provider to track your heart activity for several days, if needed. °· Stress tests by exercise or by giving medicine that makes the heart beat faster. °TREATMENT  °· Treatment depends on what may be causing your chest pain. Treatment may include: °¨ Acid blockers for  heartburn. °¨ Anti-inflammatory medicine. °¨ Pain medicine for inflammatory conditions. °¨ Antibiotics if an infection is present. °· You may be advised to change lifestyle habits. This includes stopping smoking and avoiding alcohol, caffeine, and chocolate. °· You may be advised to keep your head raised (elevated) when sleeping. This reduces the chance of acid going backward from your stomach into your esophagus. °Most of the time, nonspecific chest pain will improve within 2-3 days with rest and mild pain medicine.  °HOME CARE INSTRUCTIONS  °· If antibiotics were prescribed, take them as directed. Finish them even if you start to feel better. °· For the next few days, avoid physical activities that bring on chest pain. Continue physical activities as directed. °· Do not use any tobacco products, including cigarettes, chewing tobacco, or electronic cigarettes. °· Avoid drinking alcohol. °· Only take medicine as directed by your health care provider. °· Follow your health care provider's suggestions for further testing if your chest pain does not go away. °· Keep any follow-up appointments you made. If you do not go to an appointment, you could develop lasting (chronic) problems with pain. If there is any problem keeping an appointment, call to reschedule. °SEEK MEDICAL CARE IF:  °· Your chest pain does not go away, even after treatment. °· You have a rash with blisters on your chest. °· You have a fever. °SEEK IMMEDIATE MEDICAL CARE IF:  °· You have increased chest pain or pain that spreads to your arm, neck, jaw, back, or abdomen. °· You have shortness of breath. °· You have an increasing cough, or you cough   up blood.  You have severe back or abdominal pain.  You feel nauseous or vomit.  You have severe weakness.  You faint.  You have chills. This is an emergency. Do not wait to see if the pain will go away. Get medical help at once. Call your local emergency services (911 in [place] Do not drive  yourself to the hospital. MAKE SURE YOU:   Understand these instructions.  Will watch your condition.  Will get help right away if you are not doing well or get worse. Document Released: 09/19/2005 Document Revised: 12/15/2013 Document Reviewed: 07/15/2008 [facility] Patient Information 2015 [place], [place]. This information is not intended to replace advice given to you by your health care provider. Make sure you discuss any questions you have with your health care provider.  Chest Wall Pain Chest wall pain is pain felt in or around the chest bones and muscles. It may take up to 6 weeks to get better. It may take longer if you are active. Chest wall pain can happen on its own. Other times, things like germs, injury, coughing, or exercise can cause the pain. HOME CARE   Avoid activities that make you tired or cause pain. Try not to use your chest, belly (abdominal), or side muscles. Do not use heavy weights.  Put ice on the sore area.  Put ice in a plastic bag.  Place a towel between your skin and the bag.  Leave the ice on for 15-20 minutes for the first 2 days.  Only take medicine as told by your doctor. GET HELP RIGHT AWAY IF:   You have more pain or are very uncomfortable.  You have a fever.  Your chest pain gets worse.  You have new problems.  You feel sick to your stomach (nauseous) or throw up (vomit).  You start to sweat or feel lightheaded.  You have a cough with mucus (phlegm).  You cough up blood. MAKE SURE YOU:   Understand these instructions.  Will watch your condition.  Will get help right away if you are not doing well or get worse. Document Released: 05/28/2008 Document Revised: 03/03/2012 Document Reviewed: 08/06/2011 [facility] Patient Information 2015 [place], [place]. This information is not intended to replace advice given to you by your health care provider. Make sure you discuss any questions you have with your health care  provider.  Musculoskeletal Pain Musculoskeletal pain is muscle and boney aches and pains. These pains can occur in any part of the body. Your caregiver may treat you without knowing the cause of the pain. They may treat you if blood or urine tests, X-rays, and other tests were normal.  CAUSES There is often not a definite cause or reason for these pains. These pains may be caused by a type of germ (virus). The discomfort may also come from overuse. Overuse includes working out too hard when your body is not fit. Boney aches also come from weather changes. Bone is sensitive to atmospheric pressure changes. HOME CARE INSTRUCTIONS   Ask when your test results will be ready. Make sure you get your test results.  Only take over-the-counter or prescription medicines for pain, discomfort, or fever as directed by your caregiver. If you were given medications for your condition, do not drive, operate machinery or power tools, or sign legal documents for 24 hours. Do not drink alcohol. Do not take sleeping pills or other medications that may interfere with treatment.  Continue all activities unless the activities cause more pain. When  the pain lessens, slowly resume normal activities. Gradually increase the intensity and duration of the activities or exercise.  During periods of severe pain, bed rest may be helpful. Lay or sit in any position that is comfortable.  Putting ice on the injured area.  Put ice in a bag.  Place a towel between your skin and the bag.  Leave the ice on for 15 to 20 minutes, 3 to 4 times a day.  Follow up with your caregiver for continued problems and no reason can be found for the pain. If the pain becomes worse or does not go away, it may be necessary to repeat tests or do additional testing. Your caregiver may need to look further for a possible cause. SEEK IMMEDIATE MEDICAL CARE IF:  You have pain that is getting worse and is not relieved by medications.  You develop  chest pain that is associated with shortness or breath, sweating, feeling sick to your stomach (nauseous), or throw up (vomit).  Your pain becomes localized to the abdomen.  You develop any new symptoms that seem different or that concern you. MAKE SURE YOU:   Understand these instructions.  Will watch your condition.  Will get help right away if you are not doing well or get worse. Document Released: 12/10/2005 Document Revised: 03/03/2012 Document Reviewed: 08/14/2013 [facility] Patient Information 2015 [place], [place]. This information is not intended to replace advice given to you by your health care provider. Make sure you discuss any questions you have with your health care provider.

## 2014-12-24 NOTE — ED Notes (Signed)
Pt reports she "went out tonight and was drinking" and is feeling nauseous and has been throwing up. Dr. Norlene Campbell at bedside.

## 2014-12-24 NOTE — ED Notes (Signed)
Pt. reports left chest pain with SOB , nausea and emesis onset 12 /25/2015 , denies diaphoresis .

## 2014-12-24 NOTE — ED Notes (Signed)
Pt states that she still has pain, but she is ready to go home. Norlene Campbell, MD notified.

## 2014-12-24 NOTE — ED Provider Notes (Signed)
CSN: [id number]     Arrival date & time 12/24/14  0142 History  This chart was scribed for Olivia Mackie, MD by Tonye Royalty, ED Scribe. This patient was seen in room A01C/A01C and the patient's care was started at 2:09 AM.    Chief Complaint  Patient presents with  . Chest Pain   HPI  HPI Comments: Latoya Ibarra is a 34 y.o. female who presents to the Emergency Department complaining of left chest pain with onset 6 days ago which improved until it worsened 1 hour ago. She states that her symptoms eased up the day after they began and she states they were not bothering her until tonight. She states pain radiates to her back. She reports slight SOB. She reports nausea and vomiting today but notes she used alcohol; she states she did not have nausea and vomiting until today. She denies swelling in her axilla. She denies significant medical history. She notes current period that has lasted 17 days and is heavier than usual, she states she goes through a tampon every 2-3 hours. 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 She states she has had workup with ultrasound and lab work.  History reviewed. No pertinent past medical history. History reviewed. No pertinent past surgical history. No family history on file. History  Substance Use Topics  . Smoking status: Never Smoker   . Smokeless tobacco: Not on file  . Alcohol Use: Yes   OB History    No data available     Review of Systems   See History of Present Illness; otherwise all other systems are reviewed and negative  Allergies  Penicillins  Home Medications   Prior to Admission medications   Medication Sig Start Date End Date Taking? Authorizing Provider  amitriptyline (ELAVIL) 10 MG tablet Take 10 mg by mouth at bedtime.     Historical Provider, MD  butalbital-acetaminophen-caffeine (FIORICET) [phone] MG per tablet Take 1-2 tablets by mouth every 6 (six) hours as needed for headache.  03/07/14 03/07/15  Graylon Good, PA-C  Diclofenac (ZORVOLEX) 18 MG CAPS Take 18 mg by mouth 3 (three) times daily after meals. As needed for pain. 01/02/13   Linna Hoff, MD  metaxalone (SKELAXIN) 800 MG tablet Take 800 mg by mouth 3 (three) times daily. For pain     Historical Provider, MD  zolpidem (AMBIEN) 10 MG tablet Take 10 mg by mouth at bedtime as needed.     Historical Provider, MD   BP 127/80 mmHg  Pulse 102  Temp(Src) 98.1 F (36.7 C) (Oral)  Resp 16  Ht  (1.651 m)  Wt 189 lb (85.73 kg)  BMI 31.45 kg/m2  SpO2 100%  LMP 12/03/2014 Physical Exam  Constitutional: She is oriented to person, place, and time. She appears well-developed and well-nourished. No distress.  HENT:  Head: Normocephalic and atraumatic.  Nose: Nose normal.  Mouth/Throat: Oropharynx is clear and moist.  Eyes: Conjunctivae and EOM are normal. Pupils are equal, round, and reactive to light.  Neck: Normal range of motion. Neck supple. No JVD present. No tracheal deviation present. No thyromegaly present.  Cardiovascular: Normal rate, regular rhythm, normal heart sounds and intact distal pulses.  Exam reveals no gallop and no friction rub.   No murmur heard. Pulmonary/Chest: Effort normal and breath sounds normal. No stridor. No respiratory distress. She has no wheezes. She has no rales. She exhibits tenderness (patient has tenderness to  palpation along the left lateral chest wall and left breast.  No overlying skin changes, no crepitus).  Abdominal: Soft. Bowel sounds are normal. She exhibits no distension and no mass. There is no tenderness. There is no rebound and no guarding.  Musculoskeletal: Normal range of motion. She exhibits no edema or tenderness.  Lymphadenopathy:    She has no cervical adenopathy.  Neurological: She is alert and oriented to person, place, and time. She displays normal reflexes. She exhibits normal muscle tone. Coordination normal.  Skin: Skin is warm and dry. No rash noted.  No erythema. No pallor.  Psychiatric: She has a normal mood and affect. Her behavior is normal. Judgment and thought content normal.  Nursing note and vitals reviewed.   ED Course  Procedures (including critical care time)  DIAGNOSTIC STUDIES: Oxygen Saturation is 100% on room air, normal by my interpretation.    COORDINATION OF CARE: 2:16 AM Discussed treatment plan with patient at beside, the patient agrees with the plan and has no further questions at this time.   Labs Review Labs Reviewed  BASIC METABOLIC PANEL - Abnormal; Notable for the following:    Glucose, Bld 113 (*)    All other components within normal limits  CBC  BRAIN NATRIURETIC PEPTIDE  I-STAT TROPOININ, ED    Imaging Review Dg Chest 2 View  12/24/2014   CLINICAL DATA:  Chest pain beginning 6 days ago, worsening over last hr. Nausea and vomiting. Mild shortness of breath.  EXAM: CHEST  2 VIEW  COMPARISON:  None.  FINDINGS: The heart size and mediastinal contours are within normal limits. Mild bronchitic changes. Strandy densities in RIGHT upper lobe, LEFT lung base. Both lungs are clear. The visualized skeletal structures are unremarkable.  IMPRESSION: Mild bronchitic changes. RIGHT upper lobe and LEFT lung base atelectasis.   Electronically Signed   By: Awilda Metro   On: 12/24/2014 03:17     EKG Interpretation   Date/Time:  Friday December 24 2014 01:50:07 EST Ventricular Rate:  104 PR Interval:  166 QRS Duration: 80 QT Interval:  324 QTC Calculation: 426 R Axis:   40 Text Interpretation:  Sinus tachycardia Otherwise normal ECG Since last  tracing rate faster Confirmed by Elwin Tsou  MD, Teran Daughenbaugh ([id number]) on 12/24/2014  2:05:07 AM      MDM   Final diagnoses:  Chest wall pain  Musculoskeletal pain    34 year old female with intermittent left sided chest pain for a week.  Workup here unremarkable.  Initially tachycardic, which has resolved.  Patient has been treated you tonight and has had some nausea and  vomiting.  No signs of anemia.  Given her along to menstruation.  Will have her follow-up with her primary care doctor.  Doubt PE, pneumonia, or other life-threatening causes of chest pain.  I personally performed the services described in this documentation, which was scribed in my presence. The recorded information has been reviewed and is accurate.   Olivia Mackie, MD 12/24/14 [phone]

## 2014-12-28 ENCOUNTER — Other Ambulatory Visit: Payer: Self-pay | Admitting: Internal Medicine

## 2015-01-07 ENCOUNTER — Other Ambulatory Visit: Payer: Self-pay | Admitting: Obstetrics and Gynecology

## 2015-01-07 ENCOUNTER — Inpatient Hospital Stay (HOSPITAL_COMMUNITY): Payer: 59

## 2015-01-07 ENCOUNTER — Encounter: Payer: Self-pay | Admitting: Obstetrics and Gynecology

## 2015-01-07 ENCOUNTER — Encounter (HOSPITAL_COMMUNITY)
Admission: AD | Disposition: A | Discharge status: Home / Self Care | Payer: Self-pay | Source: Ambulatory Visit | Attending: Obstetrics and Gynecology

## 2015-01-07 ENCOUNTER — Ambulatory Visit (HOSPITAL_COMMUNITY)
Admission: AD | Admit: 2015-01-07 | Discharge: 2015-01-07 | Disposition: A | Discharge status: Home / Self Care | Payer: 59 | Source: Ambulatory Visit | Attending: Obstetrics and Gynecology | Admitting: Obstetrics and Gynecology

## 2015-01-07 ENCOUNTER — Encounter (HOSPITAL_COMMUNITY): Payer: Self-pay

## 2015-01-07 DIAGNOSIS — G43909 Migraine, unspecified, not intractable, without status migrainosus: Secondary | ICD-10-CM | POA: Insufficient documentation

## 2015-01-07 DIAGNOSIS — O001 Tubal pregnancy: Secondary | ICD-10-CM | POA: Diagnosis not present

## 2015-01-07 DIAGNOSIS — Z79899 Other long term (current) drug therapy: Secondary | ICD-10-CM | POA: Insufficient documentation

## 2015-01-07 DIAGNOSIS — O009 Unspecified ectopic pregnancy without intrauterine pregnancy: Secondary | ICD-10-CM | POA: Diagnosis present

## 2015-01-07 DIAGNOSIS — R102 Pelvic and perineal pain: Secondary | ICD-10-CM

## 2015-01-07 DIAGNOSIS — Z8759 Personal history of other complications of pregnancy, childbirth and the puerperium: Secondary | ICD-10-CM | POA: Insufficient documentation

## 2015-01-07 DIAGNOSIS — O26899 Other specified pregnancy related conditions, unspecified trimester: Secondary | ICD-10-CM

## 2015-01-07 DIAGNOSIS — Z880 Allergy status to penicillin: Secondary | ICD-10-CM | POA: Diagnosis not present

## 2015-01-07 HISTORY — DX: Other specified health status: Z78.9

## 2015-01-07 HISTORY — PX: LAPAROSCOPY: SHX197

## 2015-01-07 HISTORY — PX: UNILATERAL SALPINGECTOMY: SHX6160

## 2015-01-07 LAB — CBC
HEMATOCRIT: 39.8 % (ref 36.0–46.0)
Hemoglobin: 13.8 g/dL (ref 12.0–15.0)
MCH: 31.9 pg (ref 26.0–34.0)
MCHC: 34.7 g/dL (ref 30.0–36.0)
MCV: 92.1 fL (ref 78.0–100.0)
Platelets: 220 10*3/uL (ref 150–400)
RBC: 4.32 MIL/uL (ref 3.87–5.11)
RDW: 13.6 % (ref 11.5–15.5)
WBC: 5.3 10*3/uL (ref 4.0–10.5)

## 2015-01-07 LAB — COMPREHENSIVE METABOLIC PANEL
ALT: 15 U/L (ref 0–35)
ANION GAP: 5 (ref 5–15)
AST: 16 U/L (ref 0–37)
Albumin: 4.2 g/dL (ref 3.5–5.2)
Alkaline Phosphatase: 48 U/L (ref 39–117)
BILIRUBIN TOTAL: 1.1 mg/dL (ref 0.3–1.2)
BUN: 10 mg/dL (ref 6–23)
CO2: 25 mmol/L (ref 19–32)
Calcium: 8.6 mg/dL (ref 8.4–10.5)
Chloride: 108 mEq/L (ref 96–112)
Creatinine, Ser: 0.71 mg/dL (ref 0.50–1.10)
GFR calc Af Amer: >90 mL/min (ref >90)
GFR calc non Af Amer: >90 mL/min (ref >90)
Glucose, Bld: 86 mg/dL (ref 70–99)
POTASSIUM: 3.6 mmol/L (ref 3.5–5.1)
Sodium: 138 mmol/L (ref 135–145)
TOTAL PROTEIN: 7.1 g/dL (ref 6.0–8.3)

## 2015-01-07 LAB — TYPE AND SCREEN
ABO/RH(D): A POS
Antibody Screen: NEGATIVE

## 2015-01-07 LAB — HCG, QUANTITATIVE, PREGNANCY: hCG, Beta Chain, Quant, S: 334 m[IU]/mL — ABNORMAL HIGH (ref <5)

## 2015-01-07 SURGERY — LAPAROSCOPY, DIAGNOSTIC
Anesthesia: General | Site: Uterus

## 2015-01-07 MED ORDER — KETOROLAC TROMETHAMINE 30 MG/ML IJ SOLN
INTRAMUSCULAR | Status: AC
  Filled 2015-01-07: qty 1

## 2015-01-07 MED ORDER — HEPARIN SODIUM (PORCINE) 5000 UNIT/ML IJ SOLN
INTRAMUSCULAR | Status: AC
  Filled 2015-01-07: qty 1

## 2015-01-07 MED ORDER — MEPERIDINE HCL 25 MG/ML IJ SOLN: 6.2500 mg | INTRAMUSCULAR | Status: DC | PRN

## 2015-01-07 MED ORDER — ONDANSETRON HCL 4 MG/2ML IJ SOLN: 4.0000 mg | Freq: Once | INTRAMUSCULAR | Status: DC | PRN

## 2015-01-07 MED ORDER — MIDAZOLAM HCL 2 MG/2ML IJ SOLN
INTRAMUSCULAR | Status: DC | PRN
  Administered 2015-01-07: 2 mg via INTRAVENOUS

## 2015-01-07 MED ORDER — HYDROMORPHONE HCL 1 MG/ML IJ SOLN
INTRAMUSCULAR | Status: AC
  Filled 2015-01-07: qty 1

## 2015-01-07 MED ORDER — NEOSTIGMINE METHYLSULFATE 10 MG/10ML IV SOLN
INTRAVENOUS | Status: AC
  Filled 2015-01-07: qty 1

## 2015-01-07 MED ORDER — LIDOCAINE HCL (CARDIAC) 20 MG/ML IV SOLN
INTRAVENOUS | Status: DC | PRN
  Administered 2015-01-07: 50 mg via INTRAVENOUS

## 2015-01-07 MED ORDER — BUPIVACAINE-EPINEPHRINE (PF) 0.25% -1:200000 IJ SOLN
INTRAMUSCULAR | Status: AC
  Filled 2015-01-07: qty 30

## 2015-01-07 MED ORDER — GLYCOPYRROLATE 0.2 MG/ML IJ SOLN
INTRAMUSCULAR | Status: DC | PRN
  Administered 2015-01-07: .8 mg via INTRAVENOUS

## 2015-01-07 MED ORDER — ONDANSETRON HCL 4 MG/2ML IJ SOLN
INTRAMUSCULAR | Status: DC | PRN
  Administered 2015-01-07: 4 mg via INTRAVENOUS

## 2015-01-07 MED ORDER — MIDAZOLAM HCL 2 MG/2ML IJ SOLN
INTRAMUSCULAR | Status: AC
Start: 2015-01-07 — End: 2015-01-07
  Filled 2015-01-07: qty 2

## 2015-01-07 MED ORDER — LIDOCAINE HCL (CARDIAC) 20 MG/ML IV SOLN
INTRAVENOUS | Status: AC
  Filled 2015-01-07: qty 5

## 2015-01-07 MED ORDER — FENTANYL CITRATE 0.05 MG/ML IJ SOLN
INTRAMUSCULAR | Status: DC | PRN
  Administered 2015-01-07 (×3): 50 ug via INTRAVENOUS
  Administered 2015-01-07: 100 ug via INTRAVENOUS
  Administered 2015-01-07 (×3): 50 ug via INTRAVENOUS
  Administered 2015-01-07: 100 ug via INTRAVENOUS

## 2015-01-07 MED ORDER — 0.9 % SODIUM CHLORIDE (POUR BTL) OPTIME
TOPICAL | Status: DC | PRN
  Administered 2015-01-07: 1000 mL

## 2015-01-07 MED ORDER — HYDROCODONE-ACETAMINOPHEN 5-325 MG PO TABS
1.0000 | ORAL_TABLET | Freq: Once | ORAL | Status: AC
  Administered 2015-01-07: 1 via ORAL

## 2015-01-07 MED ORDER — KETOROLAC TROMETHAMINE 30 MG/ML IJ SOLN
INTRAMUSCULAR | Status: DC | PRN
  Administered 2015-01-07: 30 mg via INTRAVENOUS

## 2015-01-07 MED ORDER — NEOSTIGMINE METHYLSULFATE 10 MG/10ML IV SOLN
INTRAVENOUS | Status: DC | PRN
  Administered 2015-01-07: 4 mg via INTRAVENOUS

## 2015-01-07 MED ORDER — FENTANYL CITRATE 0.05 MG/ML IJ SOLN
INTRAMUSCULAR | Status: AC
  Filled 2015-01-07: qty 5

## 2015-01-07 MED ORDER — PROPOFOL 10 MG/ML IV BOLUS
INTRAVENOUS | Status: DC | PRN
  Administered 2015-01-07: 200 mg via INTRAVENOUS

## 2015-01-07 MED ORDER — BUPIVACAINE-EPINEPHRINE 0.25% -1:200000 IJ SOLN
INTRAMUSCULAR | Status: DC | PRN
  Administered 2015-01-07: 12 mL

## 2015-01-07 MED ORDER — HYDROMORPHONE HCL 1 MG/ML IJ SOLN
0.2500 mg | INTRAMUSCULAR | Status: DC | PRN
  Administered 2015-01-07 (×4): 0.5 mg via INTRAVENOUS

## 2015-01-07 MED ORDER — DEXAMETHASONE SODIUM PHOSPHATE 10 MG/ML IJ SOLN
INTRAMUSCULAR | Status: AC
  Filled 2015-01-07: qty 1

## 2015-01-07 MED ORDER — ONDANSETRON HCL 4 MG/2ML IJ SOLN
INTRAMUSCULAR | Status: AC
  Filled 2015-01-07: qty 2

## 2015-01-07 MED ORDER — HYDROCODONE-ACETAMINOPHEN 5-325 MG PO TABS
ORAL_TABLET | ORAL | Status: AC
  Filled 2015-01-07: qty 1

## 2015-01-07 MED ORDER — LACTATED RINGERS IV SOLN
INTRAVENOUS | Status: DC | PRN
  Administered 2015-01-07 (×2): via INTRAVENOUS

## 2015-01-07 MED ORDER — LACTATED RINGERS IR SOLN
Status: DC | PRN
  Administered 2015-01-07: 3000 mL

## 2015-01-07 MED ORDER — GLYCOPYRROLATE 0.2 MG/ML IJ SOLN
INTRAMUSCULAR | Status: AC
  Filled 2015-01-07: qty 1

## 2015-01-07 MED ORDER — PROPOFOL 10 MG/ML IV BOLUS
INTRAVENOUS | Status: AC
  Filled 2015-01-07: qty 20

## 2015-01-07 MED ORDER — GLYCOPYRROLATE 0.2 MG/ML IJ SOLN
INTRAMUSCULAR | Status: AC
  Filled 2015-01-07: qty 5

## 2015-01-07 MED ORDER — ROCURONIUM BROMIDE 100 MG/10ML IV SOLN
INTRAVENOUS | Status: AC
  Filled 2015-01-07: qty 1

## 2015-01-07 MED ORDER — ROCURONIUM BROMIDE 100 MG/10ML IV SOLN
INTRAVENOUS | Status: DC | PRN
  Administered 2015-01-07: 35 mg via INTRAVENOUS

## 2015-01-07 MED ORDER — DEXAMETHASONE SODIUM PHOSPHATE 10 MG/ML IJ SOLN
INTRAMUSCULAR | Status: DC | PRN
  Administered 2015-01-07: 5 mg via INTRAVENOUS

## 2015-01-07 SURGICAL SUPPLY — 44 items
BAG SPEC RTRVL LRG 6X4 10 (ENDOMECHANICALS)
BARRIER ADHS 3X4 INTERCEED (GAUZE/BANDAGES/DRESSINGS) IMPLANT
BLADE SURG 11 STRL SS (BLADE) ×5 IMPLANT
BRR ADH 4X3 ABS CNTRL BYND (GAUZE/BANDAGES/DRESSINGS)
CABLE HIGH FREQUENCY MONO STRZ (ELECTRODE) IMPLANT
CHLORAPREP W/TINT 26ML (MISCELLANEOUS) ×5 IMPLANT
CLOTH BEACON ORANGE TIMEOUT ST (SAFETY) ×5 IMPLANT
DRSG COVADERM PLUS 2X2 (GAUZE/BANDAGES/DRESSINGS) ×4 IMPLANT
DRSG OPSITE POSTOP 3X4 (GAUZE/BANDAGES/DRESSINGS) IMPLANT
DRSG TELFA 3X8 NADH (GAUZE/BANDAGES/DRESSINGS) ×5 IMPLANT
DRSG VASELINE 3X18 (GAUZE/BANDAGES/DRESSINGS) IMPLANT
FILTER SMOKE EVAC LAPAROSHD (FILTER) ×4 IMPLANT
FORCEPS CUTTING 33CM 5MM (CUTTING FORCEPS) ×3 IMPLANT
GLOVE BIO SURGEON STRL SZ 6.5 (GLOVE) ×7 IMPLANT
GLOVE BIO SURGEON STRL SZ7.5 (GLOVE) ×3 IMPLANT
GLOVE BIO SURGEONS STRL SZ 6.5 (GLOVE) ×2
GLOVE BIOGEL M 6.5 STRL (GLOVE) ×3 IMPLANT
GLOVE BIOGEL PI IND STRL 6.5 (GLOVE) ×3 IMPLANT
GLOVE BIOGEL PI IND STRL 7.0 (GLOVE) ×6 IMPLANT
GLOVE BIOGEL PI IND STRL 7.5 (GLOVE) ×1 IMPLANT
GLOVE BIOGEL PI INDICATOR 6.5 (GLOVE) ×6
GLOVE BIOGEL PI INDICATOR 7.0 (GLOVE) ×8
GLOVE BIOGEL PI INDICATOR 7.5 (GLOVE) ×2
GOWN STRL REUS W/TWL LRG LVL3 (GOWN DISPOSABLE) ×16 IMPLANT
LIQUID BAND (GAUZE/BANDAGES/DRESSINGS) ×5 IMPLANT
MANIPULATOR UTERINE 4.5 ZUMI (MISCELLANEOUS) IMPLANT
NS IRRIG 1000ML POUR BTL (IV SOLUTION) ×5 IMPLANT
PACK LAPAROSCOPY BASIN (CUSTOM PROCEDURE TRAY) ×5 IMPLANT
PAD DRESSING TELFA 3X8 NADH (GAUZE/BANDAGES/DRESSINGS) IMPLANT
PAD POSITIONER PINK NONSTERILE (MISCELLANEOUS) ×5 IMPLANT
POUCH SPECIMEN RETRIEVAL 10MM (ENDOMECHANICALS) IMPLANT
PROTECTOR NERVE ULNAR (MISCELLANEOUS) ×5 IMPLANT
SET IRRIG TUBING LAPAROSCOPIC (IRRIGATION / IRRIGATOR) ×4 IMPLANT
SOLUTION ELECTROLUBE (MISCELLANEOUS) ×3 IMPLANT
SPONGE SURGIFOAM ABS GEL 12-7 (HEMOSTASIS) ×3 IMPLANT
SUT MNCRL AB 3-0 PS2 27 (SUTURE) ×5 IMPLANT
SUT VICRYL 0 ENDOLOOP (SUTURE) IMPLANT
SUT VICRYL 0 UR6 27IN ABS (SUTURE) ×10 IMPLANT
TOWEL OR 17X24 6PK STRL BLUE (TOWEL DISPOSABLE) ×10 IMPLANT
TRAY FOLEY CATH 14FR (SET/KITS/TRAYS/PACK) ×5 IMPLANT
TROCAR BALLN 12MMX100 BLUNT (TROCAR) ×5 IMPLANT
TROCAR XCEL NON-BLD 5MMX100MML (ENDOMECHANICALS) ×5 IMPLANT
WARMER LAPAROSCOPE (MISCELLANEOUS) ×3 IMPLANT
WATER STERILE IRR 1000ML POUR (IV SOLUTION) ×5 IMPLANT

## 2015-01-07 NOTE — Op Note (Addendum)
Diagnostic Laparoscopy Procedure Note  Indications: The patient is a 34 y.o. female with left suspected ruptured ectopic pregnancy.  Pre-operative Diagnosis: left ectopic pregnancy with hemoperitoneum  Post-operative Diagnosis: same  Surgeon: ZOXWRUE,AVWUJILLARD,Deysi Soldo A   Assistants: Dr Richardson Doppole  Anesthesia: General endotracheal anesthesia  ASA Class: per anesthesia  Procedure Details  The patient was seen in the Holding Room. The risks, benefits, complications, treatment options, and expected outcomes were discussed with the patient. The possibilities of reaction to medication, pulmonary aspiration, perforation of viscus, bleeding, recurrent infection, the need for additional procedures, failure to diagnose a condition, and creating a complication requiring transfusion or operation were discussed with the patient. The patient concurred with the proposed plan, giving informed consent. The patient was taken to the Operating Room, identified as Latoya Ibarra and the procedure verified as Diagnostic Laparoscopy. A Time Out was held and the above information confirmed.  After induction of general anesthesia, the patient was placed in modified dorsal lithotomy position where she was prepped, draped, and catheterized in the normal, sterile fashion.  The cervix was visualized and an intrauterine manipulator was placed. A 2 cm umbilical incision was then performed. And carried to the fascia.  The fascia was incised and exteneded using mayo scissors.  Peritoneum was identified and entered sharply with the hemostat. Retractors were then placed into the abdomen. zero Vicryl suture was placed circumferentially around the fascia. The Roseanne RenoHassan was placed and anchored by filling the balloon.  Abdomen was insufflated with CO2 gas. after injection of the Marcaine mixture in the right and left lower quadrants  Two 5 mm ports were placed under direct visualization of the laparoscope.  400 cc of blood throughout the abdomen in  the posterior cul-de-sac extending up into the pelvic brim.   The above findings were noted. A large left ectopic seen comprising most of fallopian tube. This appeared normal. Left ovary appeared normal. Right tube and ovary appeared normal. Uterus also look normal. omentum also were normal. 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 Stasis was assured. Is some embryonic tissue or clots there were adherent to the posterior cul-de-sac and left ovary. These were removed with graspers. Irrigation was done of the entire abdomen and pelvis. Some bleeding in the posterior cul-de-sac where the tissue was removed. The foam was placed in this area. The 5 mm ports were removed under direct visualization.  Following the procedure the umbilical sheath was removed after intra-abdominal carbon dioxide was expressed. The fascia  was reapproximated by tying the 0 Vicryl circumferential suture. The incision was closed with subcutaneous and subcuticular sutures of 4-0 monocryl. The intrauterine manipulator was then removed.  Over nitrate was and pressure was used to stop the bleeding at the tenaculum site.  Instrument, sponge, and needle counts were correct prior to abdominal closure and at the conclusion of the case.     Estimated Blood Loss:  Minimal         Drains: na         Total IV Fluids: 1000mL         Specimens: left tube and ectopic pregnancy              Complications:  None; patient tolerated the procedure well.         Disposition: PACU - hemodynamically stable.         Condition: stable

## 2015-01-07 NOTE — Transfer of Care (Signed)
Immediate Anesthesia Transfer of Care Note  Patient: Latoya AllanDanielle Y Ibarra  Procedure(s) Performed: Procedure(s): LAPAROSCOPY DIAGNOSTIC WITH REMOVAL OF ECTOPIC PREGNANCY (N/A)  Patient Location: PACU  Anesthesia Type:General  Level of Consciousness: awake, alert  and oriented  Airway & Oxygen Therapy: Patient Spontanous Breathing and Patient connected to nasal cannula oxygen  Post-op Assessment: Report given to PACU RN and Post -op Vital signs reviewed and stable  Post vital signs: Reviewed and stable  Complications: No apparent anesthesia complications

## 2015-01-07 NOTE — Discharge Instructions (Signed)
Ruptured Ectopic Pregnancy An ectopic pregnancy is when the fertilized egg attaches (implants) outside the uterus. Most ectopic pregnancies occur in the fallopian tube. Rarely do ectopic pregnancies occur on the ovary, intestine, pelvis, or cervix. An ectopic pregnancy does not have the ability to develop into a normal, healthy baby.  A ruptured ectopic pregnancy is one in which the fallopian tube gets torn or bursts and results in internal bleeding. Often there is intense abdominal pain, and sometimes, vaginal bleeding. Having an ectopic pregnancy can be a life-threatening experience. If left untreated, this dangerous condition can lead to a blood transfusion, abdominal surgery, or even death.  CAUSES  Damage to the fallopian tubes is the suspected cause in most ectopic pregnancies.  RISK FACTORS Depending on your circumstances, the amount of risk of having an ectopic pregnancy will vary. There are 3 categories that may help you identify whether you are potentially at risk. High Risk  You have gone through infertility treatment.  You have had a previous ectopic pregnancy.  You have had previous tubal surgery.  You have had previous surgery to have the fallopian tubes tied (tubal ligation).  You have tubal problems or diseases.  You have been exposed to DES. DES is a medicine that was used until 1971 and had effects on babies whose mothers took the medicine.  You become pregnant while using an intrauterine device (IUD) for birth control. Moderate Risk  You have a history of infertility.  You have a history of a sexually transmitted infection (STI).  You have a history of pelvic inflammatory disease (PID).  You have scarring from endometriosis.  You have multiple sexual partners.  You smoke. Low Risk  You have had previous pelvic surgery.  You use vaginal douching.  You became sexually active before 33 years of age. SYMPTOMS An ectopic pregnancy should be suspected in  anyone who has missed a period and has abdominal pain or bleeding.  You may experience normal pregnancy symptoms, such as:  Nausea.  Tiredness.  Breast tenderness.  Symptoms that are not normal include:  Pain with intercourse.  Irregular vaginal bleeding or spotting.  Cramping or pain on one side, or in the lower abdomen.  Fast heartbeat.  Passing out while having a bowel movement.  Symptoms of a ruptured ectopic pregnancy and internal bleeding may include:  Sudden, severe pain in the abdomen and pelvis.  Dizziness or fainting.  Pain in the shoulder area. DIAGNOSIS  Tests that may be performed include:  A pregnancy test.  An ultrasound.  Testing the specific level of pregnancy hormone in the bloodstream.  Taking a sample of uterus tissue (dilation and curettage, D&C).  Surgery to perform a visual exam of the inside of the abdomen using a lighted tube (laparoscopy). TREATMENT  Laparoscopic surgery or abdominal surgery is recommended for a ruptured ectopic pregnancy.   The whole fallopian tube may need to be removed (salpingectomy).  If the tube is not too damaged, the tube may be saved, and the pregnancy will be surgically removed. Intime, the tube may still function.  If you have lost a lot of blood, you may need a blood transfusion.  You may receive a Rho (D) immune globulin shot if you are Rh negative and the father is Rh positive, or if you do not know the Rh type of the father. This is to prevent problems with any future pregnancy. SEEK IMMEDIATE MEDICAL CARE IF:  You have any symptoms of an ectopic or ruptured ectopic pregnancy. This  is a medical emergency. MAKE SURE YOU:  Understand these instructions.  Will watch your condition.  Will get help right away if you are not doing well or get worse. Document Released: 12/07/2000 Document Revised: 12/15/2013 Document Reviewed: 09/21/2013 [facility] Patient Information 2015 [place], [place]. This information  is not intended to replace advice given to you by your health care provider. Make sure you discuss any questions you have with your health care provider. Diagnostic Laparoscopy Laparoscopy is a surgical procedure. It is used to diagnose and treat diseases inside the belly (abdomen). It is usually a brief, common, and relatively simple procedure. The laparoscopeis a thin, lighted, pencil-sized instrument. It is like a telescope. It is inserted into your abdomen through a small cut (incision). Your caregiver can look at the organs inside your body through this instrument. He or she can see if there is anything abnormal. Laparoscopy can be done either in a hospital or outpatient clinic. You may be given a mild sedative to help you relax before the procedure. Once in the operating room, you will be given a drug to make you sleep (general anesthesia). Laparoscopy usually lasts less than 1 hour. After the procedure, you will be monitored in a recovery area until you are stable and doing well. Once you are home, it will take 2 to 3 days to fully recover. RISKS AND COMPLICATIONS  Laparoscopy has relatively few risks. Your caregiver will discuss the risks with you before the procedure. Some problems that can occur include:  Infection.  Bleeding.  Damage to other organs.  Anesthetic side effects. PROCEDURE Once you receive anesthesia, your surgeon inflates the abdomen with a harmless gas (carbon dioxide). This makes the organs easier to see. The laparoscope is inserted into the abdomen through a small incision. This allows your surgeon to see into the abdomen. Other small instruments are also inserted into the abdomen through other small openings. Many surgeons attach a video camera to the laparoscope to enlarge the view. During a diagnostic laparoscopy, the surgeon may be looking for inflammation, infection, or cancer. Your surgeon may take tissue samples(biopsies). The samples are sent to a specialist in  looking at cells and tissue samples (pathologist). The pathologist examines them under a microscope. Biopsies can help to diagnose or confirm a disease. AFTER THE PROCEDURE   The gas is released from inside the abdomen.  The incisions are closed with stitches (sutures). Because these incisions are small (usually less than 1/2 inch), there is usually minimal discomfort after the procedure. There may be some mild discomfort in the throat. This is from the tube placed in the throat while you were sleeping. You may have some mild abdominal discomfort. There may also be discomfort from the instrument placement incisions in the abdomen.  The recovery time is shortened as long as there are no complications.  You will rest in a recovery room until stable and doing well. As long as there are no complications, you may be allowed to go home. FINDING OUT THE RESULTS OF YOUR TEST Not all test results are available during your visit. If your test results are not back during the visit, make an appointment with your caregiver to find out the results. Do not assume everything is normal if you have not heard from your caregiver or the medical facility. It is important for you to follow up on all of your test results. HOME CARE INSTRUCTIONS   Take all medicines as directed.  Only take over-the-counter or prescription medicines for  pain, discomfort, or fever as directed by your caregiver.  Resume daily activities as directed.  Showers are preferred over baths.  You may resume sexual activities in 1 week or as directed.  Do not drive while taking narcotics. SEEK MEDICAL CARE IF:   There is increasing abdominal pain.  There is new pain in the shoulders (shoulder strap areas).  You feel lightheaded or faint.  You have the chills.  You or your child has an oral temperature above 102 F (38.9 C).  There is pus-like (purulent) drainage from any of the wounds.  You are unable to pass gas or have a bowel  movement.  You feel sick to your stomach (nauseous) or throw up (vomit). MAKE SURE YOU:   Understand these instructions.  Will watch your condition.  Will get help right away if you are not doing well or get worse. Document Released: 03/18/2001 Document Revised: 04/06/2013 Document Reviewed: 12/10/2007 [facility] Patient Information 2015 [place], [place]. This information is not intended to replace advice given to you by your health care provider. Make sure you discuss any questions you have with your health care provider.

## 2015-01-07 NOTE — Anesthesia Procedure Notes (Signed)
Procedure Name: Intubation Date/Time: 01/07/2015 6:11 PM Performed by: Jaymes GraffILLARD, NAIMA Pre-anesthesia Checklist: Patient identified, Emergency Drugs available, Suction available, Patient being monitored and Timeout performed Patient Re-evaluated:Patient Re-evaluated prior to inductionOxygen Delivery Method: Circle system utilized Preoxygenation: Pre-oxygenation with 100% oxygen Intubation Type: IV induction Ventilation: Mask ventilation without difficulty Laryngoscope Size: Miller and 1 Grade View: Grade I Tube type: Oral Number of attempts: 1 Airway Equipment and Method: Stylet Placement Confirmation: ETT inserted through vocal cords under direct vision,  positive ETCO2 and breath sounds checked- equal and bilateral Secured at: 22 cm Tube secured with: Tape Dental Injury: Teeth and Oropharynx as per pre-operative assessment

## 2015-01-07 NOTE — MAU Provider Note (Signed)
History   34 yo G1P0 with known early pregnancy presented after calling office to report increased sharp pain today.  Hx of ectopic pregnancy.  Has had cramping, but had sudden onset sharp pelvic pain today.  Still having moderate bleeding.  Denies current N/V, no syncope or dizziness.    Seen for new gyn exam 01/03/15, with + UPT and QHCG of 546.5.  US on 1/12:  Complex solid mass inferior to left ovary, appears separate from ovary, 3.1 x 2.5 x 2.5.  Trace amount of blood flow outlining the periphery of the mass.  Repeat QHCG 1/12/25/14 = 409.4.    Had been referred to Dr. Su Hiltoberts by PCP due to prolonged bleeding since 12/5.  Normal pelvic US 10/2014.  NPO since 9pm last night.  Reports hx of ectopic pregnancy "10 or 11 years ago"--does not remember which side it was on, and does not remember any medical or surgical intervention.  Patient does desire future fertility.  Patient Active Problem List   Diagnosis Date Noted  . Hx of ectopic pregnancy--"years ago", unknown laterality, no treatment 01/07/2015  . Pelvic pain in pregnancy 01/07/2015  . Allergy to penicillin 01/07/2015  . Migraines 01/07/2015  . Ectopic pregnancy--new dx. 01/07/2015    Chief Complaint  Patient presents with  . Vaginal Bleeding  . Abdominal Pain   HPI:  As above  OB History    Gravida Para Term Preterm AB TAB SAB Ectopic Multiple Living   1               PMH:  Migraines  Surgical Hx:  Tonsils as child  Family Hx:  Non-contributory  History  Substance Use Topics  . Smoking status: Never Smoker   . Smokeless tobacco: Not on file  . Alcohol Use: Yes    Allergies:  Allergies  Allergen Reactions  . Penicillins Hives    Prescriptions prior to admission  Medication Sig Dispense Refill Last Dose  . amitriptyline (ELAVIL) 10 MG tablet Take 10 mg by mouth at bedtime.    Past Week at Unknown time  . butalbital-acetaminophen-caffeine (FIORICET) 50-325-40 MG per tablet Take 1-2 tablets by mouth every 6  (six) hours as needed for headache. (Patient not taking: Reported on 12/24/2014) 20 tablet 0 Not Taking at Unknown time  . Diclofenac (ZORVOLEX) 18 MG CAPS Take 18 mg by mouth 3 (three) times daily after meals. As needed for pain. (Patient not taking: Reported on 12/24/2014) 30 capsule 1 Not Taking at Unknown time  . HYDROcodone-acetaminophen (NORCO/VICODIN) 5-325 MG per tablet Take 1-2 tablets by mouth every 4 (four) hours as needed for moderate pain or severe pain. 10 tablet 0   . metaxalone (SKELAXIN) 800 MG tablet Take 800 mg by mouth 3 (three) times daily. For pain    Not Taking at Unknown time  . naproxen (NAPROSYN) 500 MG tablet Take 1 tablet (500 mg total) by mouth 2 (two) times daily with a meal. 30 tablet 0   . ondansetron (ZOFRAN-ODT) 8 MG disintegrating tablet Take 1 tablet (8 mg total) by mouth every 8 (eight) hours as needed for nausea or vomiting. 20 tablet 0   . zolpidem (AMBIEN) 10 MG tablet Take 10 mg by mouth at bedtime as needed.    Past Week at Unknown time    ROS:  Cramping and sharp lower abdominal pain, left > right.  Small amount dark bleeding.  Physical Exam   Blood pressure 118/76, pulse 95, temperature 98.3 F (36.8 C), temperature source Oral,  resp. rate 18, last menstrual period 11/27/2014.   Physical Exam  In NAD Chest clear  Heart RRR without murmur Abd:  Mild guarding of lower abdomen, moderate pain on palpation.  No rebound.   Pelvic--small amount dark bleeding Ext WNL  Results for orders placed or performed during the hospital encounter of 01/07/15 (from the past 24 hour(s))  Type and screen     Status: None   Collection Time: 01/07/15  2:09 PM  Result Value Ref Range   ABO/RH(D) A POS    Antibody Screen NEG    Sample Expiration 01/10/2015   CBC     Status: None   Collection Time: 01/07/15  2:09 PM  Result Value Ref Range   WBC 5.3 4.0 - 10.5 K/uL   RBC 4.32 3.87 - 5.11 MIL/uL   Hemoglobin 13.8 12.0 - 15.0 g/dL   HCT 78.2 95.6 - 21.3 %   MCV 92.1  78.0 - 100.0 fL   MCH 31.9 26.0 - 34.0 pg   MCHC 34.7 30.0 - 36.0 g/dL   RDW 08.6 57.8 - 46.9 %   Platelets 220 150 - 400 K/uL  Comprehensive metabolic panel     Status: None   Collection Time: 01/07/15  2:09 PM  Result Value Ref Range   Sodium 138 135 - 145 mmol/L   Potassium 3.6 3.5 - 5.1 mmol/L   Chloride 108 96 - 112 mEq/L   CO2 25 19 - 32 mmol/L   Glucose, Bld 86 70 - 99 mg/dL   BUN 10 6 - 23 mg/dL   Creatinine, Ser 6.29 0.50 - 1.10 mg/dL   Calcium 8.6 8.4 - 52.8 mg/dL   Total Protein 7.1 6.0 - 8.3 g/dL   Albumin 4.2 3.5 - 5.2 g/dL   AST 16 0 - 37 U/L   ALT 15 0 - 35 U/L   Alkaline Phosphatase 48 39 - 117 U/L   Total Bilirubin 1.1 0.3 - 1.2 mg/dL   GFR calc non Af Amer >90 >90 mL/min   GFR calc Af Amer >90 >90 mL/min   Anion gap 5 5 - 15  hCG, quantitative, pregnancy     Status: Abnormal   Collection Time: 01/07/15  2:09 PM  Result Value Ref Range   hCG, Beta Chain, Quant, S 334 (H) <5 mIU/mL   [place]:  IMPRESSION: No intrauterine gestational sac, yolk sac, or fetal pole identified. Left adnexal mass, highly suspicious for ectopic pregnancy. Moderate free fluid increases the risk of possible rupture. Missed abortion and intrauterine pregnancy too early to be sonographically visualized are within the differential diagnosis but considered statistically less likely given these findings. Maternal uterus/adnexae: Within the left adnexa, adjacent to the ovary, is a hypoechoic oval mass measuring 5.0 x 3.0 x 2.8 cm. No internal color flow or gestational sac is identified. The right ovary is normal. Moderate free fluid is identified.  Results called to this CNM by Dr. Chilton Si from Radiology  ED Course  Assessment: Left ectopic pregnancy, possibly ruptured A+  Plan: Reviewed findings with patient, including possible options for treatment (methotrexate, surgical removal). Support to patient for findings. Consulted with Dr. Mable Fill will see the patient in  MAU.   Nigel Bridgeman CNM, MSN 01/07/2015 4:35 PM

## 2015-01-07 NOTE — MAU Note (Signed)
Pt presents from the office for an evaluation of possible ectopic pregnancy. Has had increase in pain and bleeding

## 2015-01-07 NOTE — Anesthesia Preprocedure Evaluation (Signed)
Anesthesia Evaluation  Patient identified by MRN, date of birth, ID band Patient awake    Reviewed: Allergy & Precautions, H&P , NPO status , Patient's Chart, lab work & pertinent test results, reviewed documented beta blocker date and time   Airway Mallampati: II  TM Distance: >3 FB Neck ROM: full    Dental no notable dental hx. (+) Teeth Intact   Pulmonary neg pulmonary ROS,    Pulmonary exam normal       Cardiovascular negative cardio ROS      Neuro/Psych negative psych ROS   GI/Hepatic negative GI ROS, Neg liver ROS,   Endo/Other  negative endocrine ROS  Renal/GU negative Renal ROS     Musculoskeletal   Abdominal (+)  Abdomen: tender.    Peds  Hematology negative hematology ROS (+)   Anesthesia Other Findings   Reproductive/Obstetrics negative OB ROS                             Anesthesia Physical Anesthesia Plan  ASA: II and emergent  Anesthesia Plan: General   Post-op Pain Management:    Induction: Intravenous  Airway Management Planned: Oral ETT  Additional Equipment:   Intra-op Plan:   Post-operative Plan: Extubation in OR  Informed Consent: I have reviewed the patients History and Physical, chart, labs and discussed the procedure including the risks, benefits and alternatives for the proposed anesthesia with the patient or authorized representative who has indicated his/her understanding and acceptance.   Dental Advisory Given  Plan Discussed with: CRNA and Surgeon  Anesthesia Plan Comments:         Anesthesia Quick Evaluation

## 2015-01-08 MED FILL — Heparin Sodium (Porcine) Inj 5000 Unit/ML: INTRAMUSCULAR | Qty: 1 | Status: AC

## 2015-01-08 NOTE — Anesthesia Postprocedure Evaluation (Signed)
Anesthesia Post Note  Patient: Latoya Ibarra  Procedure(s) Performed: Procedure(s) (LRB): LAPAROSCOPY DIAGNOSTIC WITH REMOVAL OF ECTOPIC PREGNANCY (N/A) LEFT SALPINGECTOMY (Left)  Anesthesia type: General  Patient location: PACU  Post pain: Pain level controlled  Post assessment: Post-op Vital signs reviewed  Last Vitals:  Filed Vitals:   01/07/15 2105  BP: 105/66  Pulse: 94  Temp: 37 C  Resp: 20    Post vital signs: Reviewed  Level of consciousness: sedated  Complications: No apparent anesthesia complications

## 2015-01-11 ENCOUNTER — Encounter (HOSPITAL_COMMUNITY): Payer: Self-pay | Admitting: Obstetrics and Gynecology

## 2015-02-21 IMAGING — CR DG CHEST 2V
2 series · 2 of 2 positions shown · non-contrast
Comparison: None.

CLINICAL DATA: Chest pain beginning 6 days ago, worsening over last
hr. Nausea and vomiting. Mild shortness of breath.

EXAM:
CHEST  2 VIEW

[chest pa]
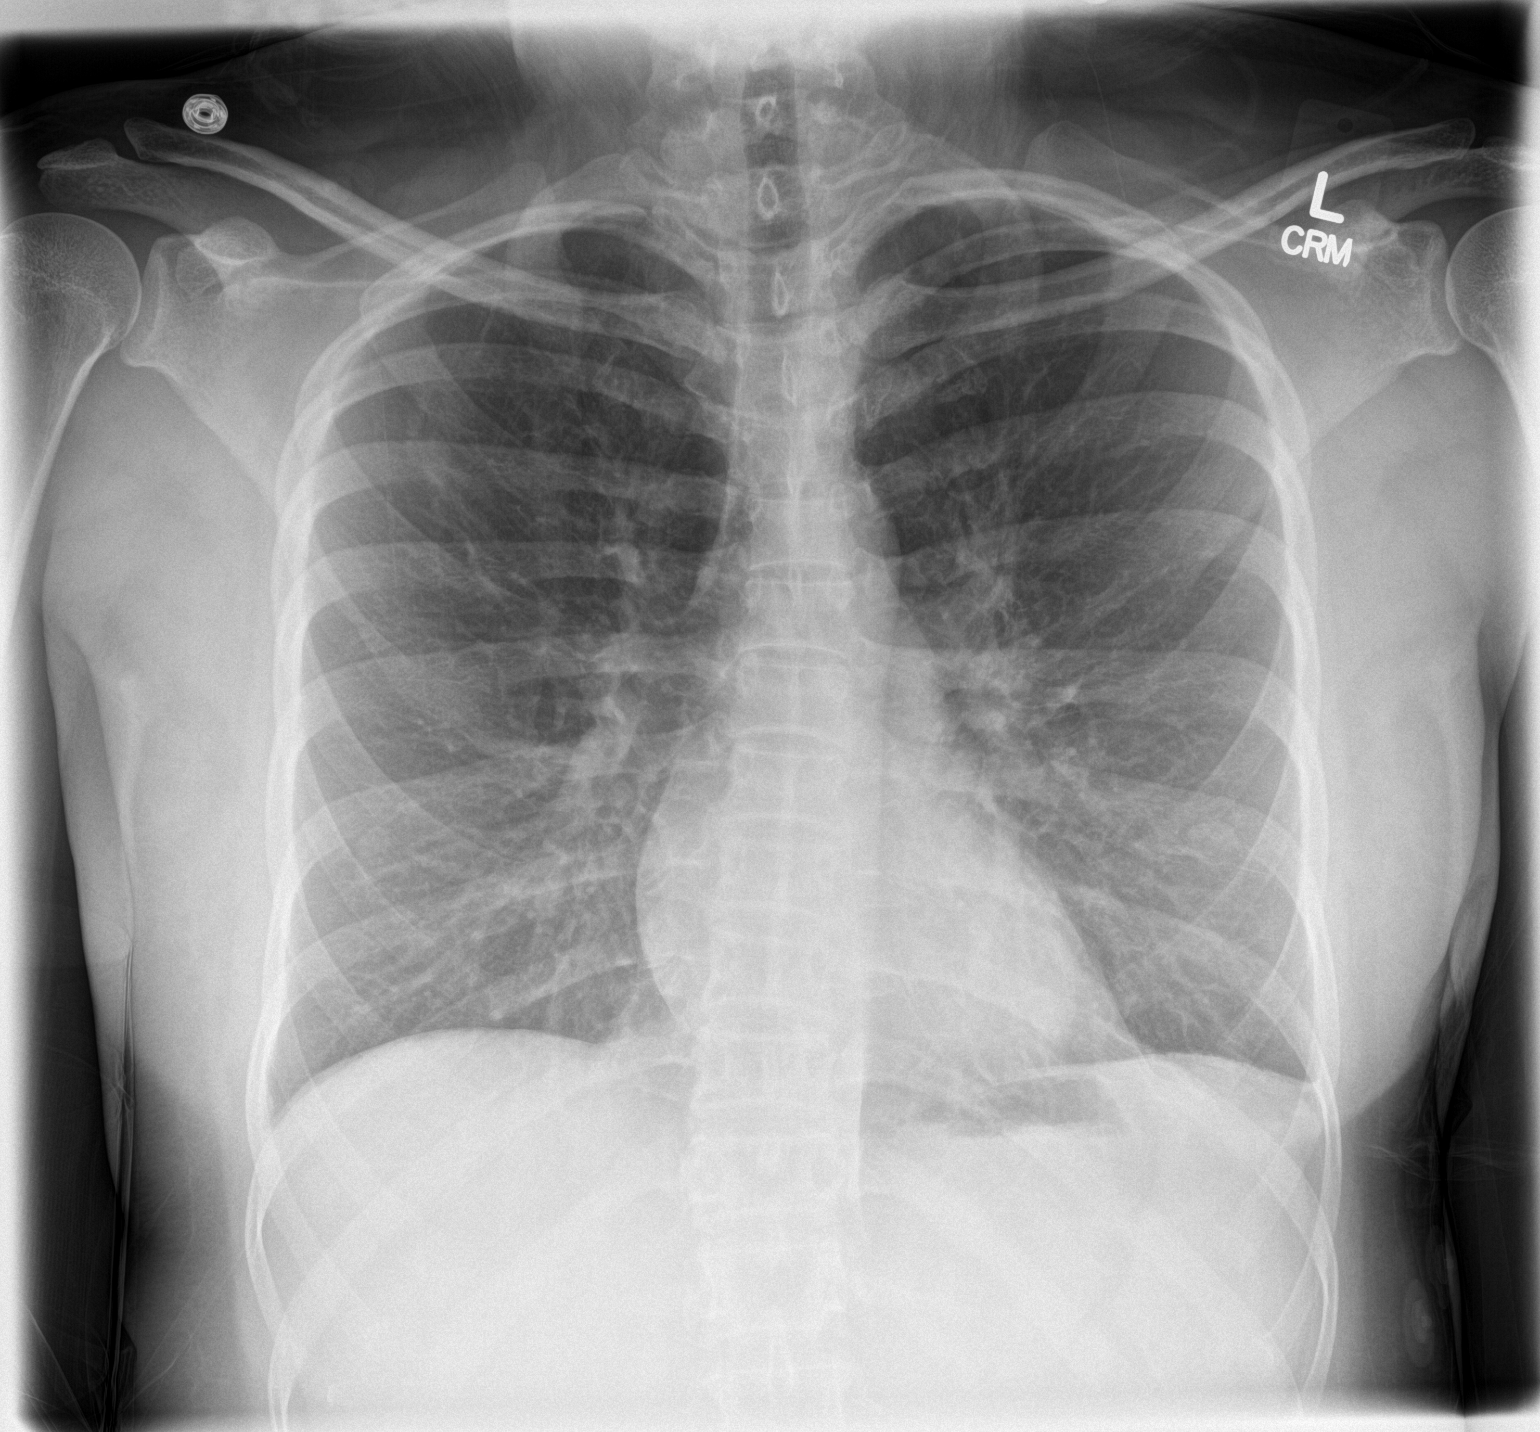

[chest lat]
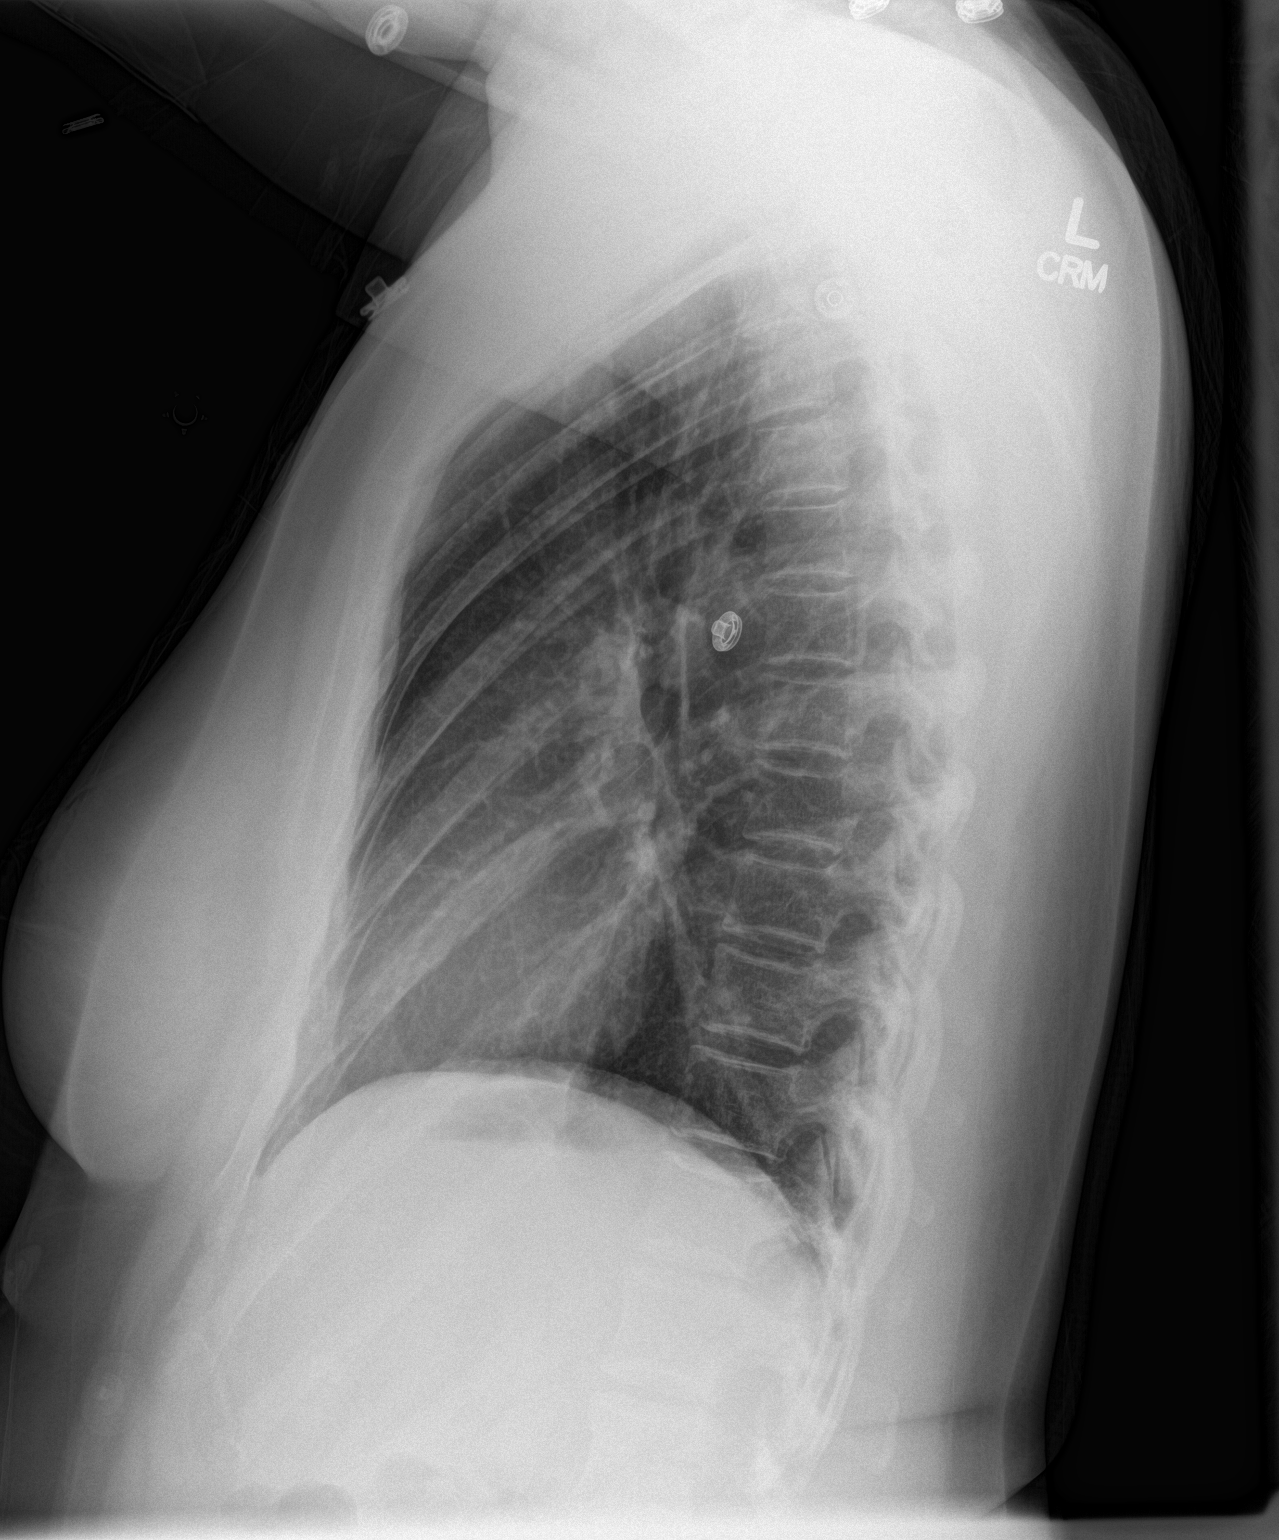

[2 of 2 positions shown; findings below may reference images not displayed]

FINDINGS: The heart size and mediastinal contours are within normal limits.
Mild bronchitic changes. Strandy densities in RIGHT upper lobe, LEFT
lung base. Both lungs are clear. The visualized skeletal structures
are unremarkable.
IMPRESSION: Mild bronchitic changes. RIGHT upper lobe and LEFT lung base
atelectasis.

  By: Lemoj Kibria

## 2015-02-26 ENCOUNTER — Inpatient Hospital Stay (HOSPITAL_COMMUNITY)
Admission: AD | Admit: 2015-02-26 | Discharge: 2015-02-26 | Disposition: A | Discharge status: Home / Self Care | Payer: 59 | Source: Ambulatory Visit | Attending: Obstetrics and Gynecology | Admitting: Obstetrics and Gynecology

## 2015-02-26 ENCOUNTER — Encounter (HOSPITAL_COMMUNITY): Payer: Self-pay | Admitting: *Deleted

## 2015-02-26 DIAGNOSIS — R0989 Other specified symptoms and signs involving the circulatory and respiratory systems: Secondary | ICD-10-CM | POA: Diagnosis present

## 2015-02-26 DIAGNOSIS — Z880 Allergy status to penicillin: Secondary | ICD-10-CM | POA: Insufficient documentation

## 2015-02-26 DIAGNOSIS — R6883 Chills (without fever): Secondary | ICD-10-CM | POA: Insufficient documentation

## 2015-02-26 DIAGNOSIS — F329 Major depressive disorder, single episode, unspecified: Secondary | ICD-10-CM | POA: Diagnosis not present

## 2015-02-26 HISTORY — DX: Headache: R51

## 2015-02-26 HISTORY — DX: Headache, unspecified: R51.9

## 2015-02-26 LAB — URINALYSIS, ROUTINE W REFLEX MICROSCOPIC
BILIRUBIN URINE: NEGATIVE
Glucose, UA: NEGATIVE mg/dL
KETONES UR: NEGATIVE mg/dL
LEUKOCYTES UA: NEGATIVE
NITRITE: NEGATIVE
Protein, ur: NEGATIVE mg/dL
Specific Gravity, Urine: >1.03 — ABNORMAL HIGH (ref 1.005–1.030)
UROBILINOGEN UA: 0.2 mg/dL (ref 0.0–1.0)
pH: 6 (ref 5.0–8.0)

## 2015-02-26 LAB — INFLUENZA PANEL BY PCR (TYPE A & B)
H1N1 flu by pcr: NOT DETECTED
INFLAPCR: NEGATIVE
INFLBPCR: NEGATIVE

## 2015-02-26 LAB — URINE MICROSCOPIC-ADD ON

## 2015-02-26 LAB — POCT PREGNANCY, URINE: Preg Test, Ur: NEGATIVE

## 2015-02-26 MED ORDER — OSELTAMIVIR PHOSPHATE 75 MG PO CAPS: 75.0000 mg | ORAL_CAPSULE | Freq: Two times a day (BID) | ORAL | Status: AC

## 2015-02-26 NOTE — MAU Provider Note (Signed)
History   34 yo G2P0020 presented unannounced c/o body aches, runny nose, chills, sweats, occasional diarrhea, no vomiting.  Denies abdominal pain, dysuria, known viral exposures.  Received flu vaccine 10/2014.  Has taken Theraflu, Mucinex, and Dayquil with minimal benefit.  Hx remarkable for ruptured ectopic 01/08/15, with removal of left tube. Remote hx of prior ectopic.  Seen by Dr. Su Hiltoberts 02/09/15 for depression--started on Zoloft, but patient reports sx of depression worsened after taking, and she stopped the medicine after 1 week.  She reports she is "working through the issues" and has good support from family and church.  She would prefer not to take med.  She is to see Dr. Su Hiltoberts in 1-2 weeks for re-evaluation.    Patient Active Problem List   Diagnosis Date Noted  . Viral syndrome 02/26/2015  . Hx of ectopic pregnancy--"years ago", unknown laterality, no treatment 01/07/2015  . Pelvic pain in pregnancy 01/07/2015  . Allergy to penicillin 01/07/2015  . Migraines 01/07/2015  . Ectopic pregnancy 01/08/15--removal left tube 01/07/2015    Chief Complaint  Patient presents with  . Influenza   HPI:  See above  OB History    Gravida Para Term Preterm AB TAB SAB Ectopic Multiple Living   2    2  1 1   0      Past Medical History  Diagnosis Date  . Medical history non-contributory   . Headache     Past Surgical History  Procedure Laterality Date  . Laparoscopy N/A 01/07/2015    Procedure: LAPAROSCOPY DIAGNOSTIC WITH REMOVAL OF ECTOPIC PREGNANCY;  Surgeon: Jaymes GraffNaima Dillard, MD;  Location: [facility];  Service: Gynecology;  Laterality: N/A;  . Unilateral salpingectomy Left 01/07/2015    Procedure: LEFT SALPINGECTOMY;  Surgeon: Jaymes GraffNaima Dillard, MD;  Location: [facility];  Service: Gynecology;  Laterality: Left;    Family History  Problem Relation Age of Onset  . Diabetes Father     History  Substance Use Topics  . Smoking status: Never Smoker   . Smokeless tobacco: Not on file  .  Alcohol Use: Yes     Comment: occ wine    Allergies:  Allergies  Allergen Reactions  . Penicillins Hives    Prescriptions prior to admission  Medication Sig Dispense Refill Last Dose  . amitriptyline (ELAVIL) 10 MG tablet Take 10 mg by mouth at bedtime.    Past Week at Unknown time  . BIOTIN PO Take 1 tablet by mouth daily.   Past Week at Unknown time  . Prenatal Vit-Fe Fumarate-FA (PRENATAL MULTIVITAMIN) TABS tablet Take 1 tablet by mouth daily at 12 noon.   Past Week at Unknown time  . zolpidem (AMBIEN) 10 MG tablet Take 10 mg by mouth at bedtime as needed.    Past Week at Unknown time  . butalbital-acetaminophen-caffeine (FIORICET) 50-325-40 MG per tablet Take 1-2 tablets by mouth every 6 (six) hours as needed for headache. (Patient not taking: Reported on 12/24/2014) 20 tablet 0 Not Taking at Unknown time  . HYDROcodone-acetaminophen (NORCO/VICODIN) 5-325 MG per tablet Take 1-2 tablets by mouth every 4 (four) hours as needed for moderate pain or severe pain. (Patient not taking: Reported on 02/26/2015) 10 tablet 0 Past Week at Unknown time  . ondansetron (ZOFRAN-ODT) 8 MG disintegrating tablet Take 1 tablet (8 mg total) by mouth every 8 (eight) hours as needed for nausea or vomiting. (Patient not taking: Reported on 02/26/2015) 20 tablet 0 prn    ROS:  Body aches, runny nose, occasional diarrhea,  Physical Exam   Blood pressure 126/85, pulse 90, temperature 97.9 F (36.6 C), temperature source Oral, resp. rate 16, height  (1.676 m), weight 201 lb 12.8 oz (91.536 kg), last menstrual period 11/27/2014, unknown if currently breastfeeding.  Filed Vitals:   02/26/15 0638 02/26/15 0815  BP: 114/94 126/85  Pulse: 98 90  Temp: 98 F (36.7 C) 97.9 F (36.6 C)  TempSrc:  Oral  Resp: 18 16  Height:  (1.676 m)   Weight: 201 lb 12.8 oz (91.536 kg)     Physical Exam  In NAD Ears--canal with significant cerumen, drums clear Throat--mildly erythematous No lymphadenopathy in  neck Chest clear Heart RRR without murmur Abd soft, NT Pelvic--deferred Ext WNL  Results for orders placed or performed during the hospital encounter of 02/26/15 (from the past 24 hour(s))  Urinalysis, Routine w reflex microscopic     Status: Abnormal   Collection Time: 02/26/15  6:52 AM  Result Value Ref Range   Color, Urine YELLOW YELLOW   APPearance CLEAR CLEAR   Specific Gravity, Urine >1.030 (H) 1.005 - 1.030   pH 6.0 5.0 - 8.0   Glucose, UA NEGATIVE NEGATIVE mg/dL   Hgb urine dipstick TRACE (A) NEGATIVE   Bilirubin Urine NEGATIVE NEGATIVE   Ketones, ur NEGATIVE NEGATIVE mg/dL   Protein, ur NEGATIVE NEGATIVE mg/dL   Urobilinogen, UA 0.2 0.0 - 1.0 mg/dL   Nitrite NEGATIVE NEGATIVE   Leukocytes, UA NEGATIVE NEGATIVE  Urine microscopic-add on     Status: Abnormal   Collection Time: 02/26/15  6:52 AM  Result Value Ref Range   Squamous Epithelial / LPF MANY (A) RARE   WBC, UA 0-2 <3 WBC/hpf   RBC / HPF 0-2 <3 RBC/hpf   Urine-Other MUCOUS PRESENT   Pregnancy, urine POC     Status: None   Collection Time: 02/26/15  6:57 AM  Result Value Ref Range   Preg Test, Ur NEGATIVE NEGATIVE    ED Course  Assessment: Viral sx, ? Possible influenza  Plan: Influenza PCR pending. Rx Tamiflu with instructions. Immodium for diarrhea as needed. Push po fluids. F/u with [facility] as directed for f/u on depression. If viral sx worsen, present to ER/Urgent Care.    Nigel Bridgeman CNM, MSN 02/26/2015 8:19 AM

## 2015-02-26 NOTE — MAU Note (Addendum)
Since Friday am have had achiness, runny nose, feel cold but perspiring. Haven't taken temp. Diarrhea but no vomiting. Has taken Theraflu, Mucinex and Dayquil. Had surgery on 01/08/15 for ectopic pregnancy.

## 2015-02-26 NOTE — Discharge Instructions (Signed)
Influenza Influenza ("the flu") is a viral infection of the respiratory tract. It occurs more often in winter months because people spend more time in close contact with one another. Influenza can make you feel very sick. Influenza easily spreads from person to person (contagious). CAUSES  Influenza is caused by a virus that infects the respiratory tract. You can catch the virus by breathing in droplets from an infected person's cough or sneeze. You can also catch the virus by touching something that was recently contaminated with the virus and then touching your mouth, nose, or eyes. RISKS AND COMPLICATIONS You may be at risk for a more severe case of influenza if you smoke cigarettes, have diabetes, have chronic heart disease (such as heart failure) or lung disease (such as asthma), or if you have a weakened immune system. Elderly people and pregnant women are also at risk for more serious infections. The most common problem of influenza is a lung infection (pneumonia). Sometimes, this problem can require emergency medical care and may be life threatening. SIGNS AND SYMPTOMS  Symptoms typically last 4 to 10 days and may include:  Fever.  Chills.  Headache, body aches, and muscle aches.  Sore throat.  Chest discomfort and cough.  Poor appetite.  Weakness or feeling tired.  Dizziness.  Nausea or vomiting. DIAGNOSIS  Diagnosis of influenza is often made based on your history and a physical exam. A nose or throat swab test can be done to confirm the diagnosis. TREATMENT  In mild cases, influenza goes away on its own. Treatment is directed at relieving symptoms. For more severe cases, your health care provider may prescribe antiviral medicines to shorten the sickness. Antibiotic medicines are not effective because the infection is caused by a virus, not by bacteria. HOME CARE INSTRUCTIONS  Take medicines only as directed by your health care provider.  Use a cool mist humidifier to make  breathing easier.  Get plenty of rest until your temperature returns to normal. This usually takes 3 to 4 days.  Drink enough fluid to keep your urine clear or pale yellow.  Cover yourmouth and nosewhen coughing or sneezing,and wash your handswellto prevent thevirusfrom spreading.  Stay homefromwork orschool untilthe fever is gonefor at least 201full day. PREVENTION  An annual influenza vaccination (flu shot) is the best way to avoid getting influenza. An annual flu shot is now routinely recommended for all adults in the [place] SEEK MEDICAL CARE IF:  You experiencechest pain, yourcough worsens,or you producemore mucus.  Youhave nausea,vomiting, ordiarrhea.   Diarrhea Diarrhea is frequent loose and watery bowel movements. It can cause you to feel weak and dehydrated. Dehydration can cause you to become tired and thirsty, have a dry mouth, and have decreased urination that often is dark yellow. Diarrhea is a sign of another problem, most often an infection that will not last long. In most cases, diarrhea typically lasts 2-3 days. However, it can last longer if it is a sign of something more serious. It is important to treat your diarrhea as directed by your caregiver to lessen or prevent future episodes of diarrhea. CAUSES  Some common causes include: Gastrointestinal infections caused by viruses, bacteria, or parasites. Food poisoning or food allergies. Certain medicines, such as antibiotics, chemotherapy, and laxatives. Artificial sweeteners and fructose. Digestive disorders. HOME CARE INSTRUCTIONS Ensure adequate fluid intake (hydration): Have 1 cup (8 oz) of fluid for each diarrhea episode. Avoid fluids that contain simple sugars or sports drinks, fruit juices, whole milk products, and sodas. Your  urine should be clear or pale yellow if you are drinking enough fluids. Hydrate with an oral rehydration solution that you can purchase at pharmacies, retail stores, and  online. You can prepare an oral rehydration solution at home by mixing the following ingredients together:  - tsp table salt.  tsp baking soda.  tsp salt substitute containing potassium chloride. 1  tablespoons sugar. 1 L (34 oz) of water. Certain foods and beverages may increase the speed at which food moves through the gastrointestinal (GI) tract. These foods and beverages should be avoided and include: Caffeinated and alcoholic beverages. High-fiber foods, such as raw fruits and vegetables, nuts, seeds, and whole grain breads and cereals. Foods and beverages sweetened with sugar alcohols, such as xylitol, sorbitol, and mannitol. Some foods may be well tolerated and may help thicken stool including: Starchy foods, such as rice, toast, pasta, low-sugar cereal, oatmeal, grits, baked potatoes, crackers, and bagels. Bananas. Applesauce. Add probiotic-rich foods to help increase healthy bacteria in the GI tract, such as yogurt and fermented milk products. Wash your hands well after each diarrhea episode. Only take over-the-counter or prescription medicines as directed by your caregiver. Take a warm bath to relieve any burning or pain from frequent diarrhea episodes. SEEK IMMEDIATE MEDICAL CARE IF:  You are unable to keep fluids down. You have persistent vomiting. You have blood in your stool, or your stools are black and tarry. You do not urinate in 6-8 hours, or there is only a small amount of very dark urine. You have abdominal pain that increases or localizes. You have weakness, dizziness, confusion, or light-headedness. You have a severe headache. Your diarrhea gets worse or does not get better. You have a fever or persistent symptoms for more than 2-3 days. You have a fever and your symptoms suddenly get worse. MAKE SURE YOU:  Understand these instructions. Will watch your condition. Will get help right away if you are not doing well or get worse. Document Released: 11/30/2002  Document Revised: 04/26/2014 Document Reviewed: 08/17/2012 [facility] Patient Information 2015 [place], [place]. This information is not intended to replace advice given to you by your health care provider. Make sure you discuss any questions you have with your health care provider.  Your fever returns or gets worse. SEEK IMMEDIATE MEDICAL CARE IF:  You havetrouble breathing, you become short of breath,or your skin ornails becomebluish.  You have severe painor stiffnessin the neck.  You develop a sudden headache, or pain in the face or ear.  You have nausea or vomiting that you cannot control. MAKE SURE YOU:   Understand these instructions.  Will watch your condition.  Will get help right away if you are not doing well or get worse. Document Released: 12/07/2000 Document Revised: 04/26/2014 Document Reviewed: 03/10/2012 [facility] Patient Information 2015 [place], [place]. This information is not intended to replace advice given to you by your health care provider. Make sure you discuss any questions you have with your health care provider.

## 2015-02-26 NOTE — Progress Notes (Signed)
Gerrit HeckJessica Emly CNM notified via voice mail of pt's admission and status.

## 2015-02-26 NOTE — Progress Notes (Signed)
V.Latham CNM called unit and updated as to pt's admission and status. Flu PCR ordered and CNM will see pt

## 2015-02-28 ENCOUNTER — Telehealth (HOSPITAL_COMMUNITY): Payer: Self-pay | Admitting: *Deleted

## 2015-07-01 ENCOUNTER — Encounter (HOSPITAL_COMMUNITY): Payer: Self-pay | Admitting: *Deleted

## 2015-07-01 ENCOUNTER — Inpatient Hospital Stay (HOSPITAL_COMMUNITY)
Admission: AD | Admit: 2015-07-01 | Discharge: 2015-07-01 | Disposition: A | Discharge status: Home / Self Care | Payer: 59 | Source: Ambulatory Visit | Attending: Obstetrics and Gynecology | Admitting: Obstetrics and Gynecology

## 2015-07-01 DIAGNOSIS — N939 Abnormal uterine and vaginal bleeding, unspecified: Secondary | ICD-10-CM | POA: Diagnosis present

## 2015-07-01 DIAGNOSIS — Z880 Allergy status to penicillin: Secondary | ICD-10-CM | POA: Insufficient documentation

## 2015-07-01 HISTORY — DX: Anxiety disorder, unspecified: F41.9

## 2015-07-01 LAB — URINALYSIS, ROUTINE W REFLEX MICROSCOPIC
Bilirubin Urine: NEGATIVE
Glucose, UA: NEGATIVE mg/dL
Ketones, ur: NEGATIVE mg/dL
Leukocytes, UA: NEGATIVE
NITRITE: NEGATIVE
PH: 6.5 (ref 5.0–8.0)
Protein, ur: NEGATIVE mg/dL
SPECIFIC GRAVITY, URINE: 1.02 (ref 1.005–1.030)
UROBILINOGEN UA: 0.2 mg/dL (ref 0.0–1.0)

## 2015-07-01 LAB — CBC WITH DIFFERENTIAL/PLATELET
Basophils Absolute: 0 10*3/uL (ref 0.0–0.1)
Basophils Relative: 0 % (ref 0–1)
Eosinophils Absolute: 0.1 10*3/uL (ref 0.0–0.7)
Eosinophils Relative: 2 % (ref 0–5)
HCT: 42.4 % (ref 36.0–46.0)
HEMOGLOBIN: 15.2 g/dL — AB (ref 12.0–15.0)
LYMPHS PCT: 36 % (ref 12–46)
Lymphs Abs: 1.8 10*3/uL (ref 0.7–4.0)
MCH: 32.5 pg (ref 26.0–34.0)
MCHC: 35.8 g/dL (ref 30.0–36.0)
MCV: 90.8 fL (ref 78.0–100.0)
Monocytes Absolute: 0.3 10*3/uL (ref 0.1–1.0)
Monocytes Relative: 6 % (ref 3–12)
NEUTROS ABS: 2.9 10*3/uL (ref 1.7–7.7)
NEUTROS PCT: 56 % (ref 43–77)
PLATELETS: 224 10*3/uL (ref 150–400)
RBC: 4.67 MIL/uL (ref 3.87–5.11)
RDW: 13.2 % (ref 11.5–15.5)
WBC: 5.1 10*3/uL (ref 4.0–10.5)

## 2015-07-01 LAB — POCT PREGNANCY, URINE: Preg Test, Ur: NEGATIVE

## 2015-07-01 LAB — URINE MICROSCOPIC-ADD ON

## 2015-07-01 LAB — HCG, QUANTITATIVE, PREGNANCY: hCG, Beta Chain, Quant, S: <1 m[IU]/mL (ref <5)

## 2015-07-01 NOTE — MAU Note (Signed)
Pt states here for vaginal bleeding. Usually starts cycle on 20th of the month. Began bleeding today. Unsure if she is pregnant. Hx ectopic pregnancy in January w tube removal.

## 2015-07-01 NOTE — MAU Provider Note (Signed)
Latoya Ibarra is a 34 y.o. G3P0 non pregnant female c/o vb like last year when she lost her tube.     History     Patient Active Problem List   Diagnosis Date Noted  . Viral syndrome 02/26/2015  . Hx of ectopic pregnancy--"years ago", unknown laterality, no treatment 01/07/2015  . Pelvic pain in pregnancy 01/07/2015  . Allergy to penicillin 01/07/2015  . Migraines 01/07/2015  . Ectopic pregnancy 01/08/15--removal left tube 01/07/2015    Chief Complaint  Patient presents with  . Vaginal Bleeding   HPI  OB History    Gravida Para Term Preterm AB TAB SAB Ectopic Multiple Living   0      Past Medical History  Diagnosis Date  . Medical history non-contributory   . Headache   . Anxiety     Past Surgical History  Procedure Laterality Date  . Laparoscopy N/A 01/07/2015    Procedure: LAPAROSCOPY DIAGNOSTIC WITH REMOVAL OF ECTOPIC PREGNANCY;  Surgeon: Jaymes Graff, MD;  Location: [facility];  Service: Gynecology;  Laterality: N/A;  . Unilateral salpingectomy Left 01/07/2015    Procedure: LEFT SALPINGECTOMY;  Surgeon: Jaymes Graff, MD;  Location: [facility];  Service: Gynecology;  Laterality: Left;  . Therapeutic abortion      Family History  Problem Relation Age of Onset  . Diabetes Father     History  Substance Use Topics  . Smoking status: Never Smoker   . Smokeless tobacco: Never Used  . Alcohol Use: No     Comment: occ wine    Allergies:  Allergies  Allergen Reactions  . Penicillins Hives    Prescriptions prior to admission  Medication Sig Dispense Refill Last Dose  . amitriptyline (ELAVIL) 25 MG tablet Take 25 mg by mouth at bedtime.   06/30/2015 at Unknown time  . Prenatal Vit-Fe Fumarate-FA (PRENATAL MULTIVITAMIN) TABS tablet Take 1 tablet by mouth daily at 12 noon.   Past Week at Unknown time  . valACYclovir (VALTREX) 500 MG tablet Take 500 mg by mouth daily as needed (for suppression).   prn  . zolpidem (AMBIEN) 10 MG tablet Take 10 mg by mouth  at bedtime as needed for sleep.    06/30/2015 at Unknown time  . ondansetron (ZOFRAN-ODT) 8 MG disintegrating tablet Take 1 tablet (8 mg total) by mouth every 8 (eight) hours as needed for nausea or vomiting. (Patient not taking: Reported on 02/26/2015) 20 tablet 0 prn  UTO negative  ROS See HPI above, all other systems are negative  Physical Exam   Blood pressure 122/79, pulse 84, temperature 98.3 F (36.8 C), temperature source Oral, resp. rate 16, height  (1.626 m), weight 207 lb 2 oz (93.951 kg), last menstrual period 06/13/2015, not currently breastfeeding.  Physical Exam Ext:  WNL ABD: Soft, non tender to palpation, no rebound or guarding SVE:   ED Course  Assessment:    Plan:  Labs: CBC, quant VE   Latoya Ibarra, CNM, MSN 07/01/2015. 2:17 PM  MAU Addendum Note  Results for orders placed or performed during the hospital encounter of 07/01/15 (from the past 24 hour(s))  Urinalysis, Routine w reflex microscopic (not at [facility])     Status: Abnormal   Collection Time: 07/01/15  1:35 PM  Result Value Ref Range   Color, Urine YELLOW YELLOW   APPearance CLEAR CLEAR   Specific Gravity, Urine 1.020 1.005 - 1.030   pH 6.5 5.0 - 8.0  Glucose, UA NEGATIVE NEGATIVE mg/dL   Hgb urine dipstick LARGE (A) NEGATIVE   Bilirubin Urine NEGATIVE NEGATIVE   Ketones, ur NEGATIVE NEGATIVE mg/dL   Protein, ur NEGATIVE NEGATIVE mg/dL   Urobilinogen, UA 0.2 0.0 - 1.0 mg/dL   Nitrite NEGATIVE NEGATIVE   Leukocytes, UA NEGATIVE NEGATIVE  Urine microscopic-add on     Status: Abnormal   Collection Time: 07/01/15  1:35 PM  Result Value Ref Range   Squamous Epithelial / LPF FEW (A) RARE   WBC, UA 0-2 <3 WBC/hpf   RBC / HPF 3-6 <3 RBC/hpf   Bacteria, UA MANY (A) RARE   Urine-Other MUCOUS PRESENT   Pregnancy, urine POC     Status: None   Collection Time: 07/01/15  2:04 PM  Result Value Ref Range   Preg Test, Ur NEGATIVE NEGATIVE  hCG, quantitative, pregnancy     Status: None    Collection Time: 07/01/15  2:49 PM  Result Value Ref Range   hCG, Beta Chain, Quant, S <1 <5 mIU/mL  CBC with Differential/Platelet     Status: Abnormal   Collection Time: 07/01/15  2:49 PM  Result Value Ref Range   WBC 5.1 4.0 - 10.5 K/uL   RBC 4.67 3.87 - 5.11 MIL/uL   Hemoglobin 15.2 (H) 12.0 - 15.0 g/dL   HCT 16.142.4 09.636.0 - 04.546.0 %   MCV 90.8 78.0 - 100.0 fL   MCH 32.5 26.0 - 34.0 pg   MCHC 35.8 30.0 - 36.0 g/dL   RDW 40.913.2 81.111.5 - 91.415.5 %   Platelets 224 150 - 400 K/uL   Neutrophils Relative % 56 43 - 77 %   Neutro Abs 2.9 1.7 - 7.7 K/uL   Lymphocytes Relative 36 12 - 46 %   Lymphs Abs 1.8 0.7 - 4.0 K/uL   Monocytes Relative 6 3 - 12 %   Monocytes Absolute 0.3 0.1 - 1.0 K/uL   Eosinophils Relative 2 0 - 5 %   Eosinophils Absolute 0.1 0.0 - 0.7 K/uL   Basophils Relative 0 0 - 1 %   Basophils Absolute 0.0 0.0 - 0.1 K/uL     Plan: -Discussed need to follow up in office  -Encouraged to call if any questions or concerns arise prior to next scheduled office visit.  -Discharged to home in stable condition Consulted with Dr. Sande Brothersoberts   Latoya Ibarra, CNM, MSN 07/01/2015. 5:26 PM

## 2015-07-01 NOTE — MAU Note (Signed)
Feels like a menstrual cycle, but that is what she thought last year, so that's why she came in

## 2015-08-01 ENCOUNTER — Encounter (HOSPITAL_COMMUNITY): Payer: Self-pay

## 2015-08-01 ENCOUNTER — Inpatient Hospital Stay (HOSPITAL_COMMUNITY)
Admission: AD | Admit: 2015-08-01 | Discharge: 2015-08-01 | Disposition: A | Discharge status: Home / Self Care | Payer: 59 | Source: Ambulatory Visit | Attending: Obstetrics and Gynecology | Admitting: Obstetrics and Gynecology

## 2015-08-01 DIAGNOSIS — Z880 Allergy status to penicillin: Secondary | ICD-10-CM | POA: Diagnosis not present

## 2015-08-01 DIAGNOSIS — N946 Dysmenorrhea, unspecified: Secondary | ICD-10-CM | POA: Insufficient documentation

## 2015-08-01 LAB — URINALYSIS, ROUTINE W REFLEX MICROSCOPIC
Bilirubin Urine: NEGATIVE
GLUCOSE, UA: NEGATIVE mg/dL
Ketones, ur: NEGATIVE mg/dL
LEUKOCYTES UA: NEGATIVE
Nitrite: NEGATIVE
PH: 5.5 (ref 5.0–8.0)
PROTEIN: NEGATIVE mg/dL
Specific Gravity, Urine: <1.005 — ABNORMAL LOW (ref 1.005–1.030)
UROBILINOGEN UA: 0.2 mg/dL (ref 0.0–1.0)

## 2015-08-01 LAB — URINE MICROSCOPIC-ADD ON

## 2015-08-01 LAB — POCT PREGNANCY, URINE: Preg Test, Ur: NEGATIVE

## 2015-08-01 MED ORDER — HYDROCODONE-ACETAMINOPHEN 5-325 MG PO TABS: 1.0000 | ORAL_TABLET | ORAL | Status: DC | PRN

## 2015-08-01 NOTE — MAU Note (Signed)
Started period yesterday morning having bad lower abdominal cramping since last night, every month seems to get worse and worse every month. History of ectopic pregnancy in January surgery left tube removed.  Changing pad/tampon every hour.

## 2015-08-01 NOTE — MAU Provider Note (Signed)
  History   34 yo G3P0030 presented unannounced c/o dysmenorrhea since having left salpingectomy 12/2014 for ectopic pregnancy.  Patient had called this CNM around 7:45am regarding dysmenorrhea, and I had instructed her to go to the office for a 9am appt with Henreitta Leber, PA-C.  Patient presented to MAU--upon questioning, she reports she thought she was to come to MAU.   Hx ectopic "years ago", with uncertain locality and treatment modality, and a ruptured ectopic on the left in 12/2014, with removal of the left tube by Dr. Normand Sloop.  Patient reports cycles restarted after about 2 months, with significant dysmenorrha beginning just before cycle and extending through the cycle.  Denies heavy flow.  Does desire pregnancy, not using contraception.  Denies STD risk, no vaginal d/c, or urinary sx. Has used Ibuprophen with minimal benefit.  Patient Active Problem List   Diagnosis Date Noted  . Hx of ectopic pregnancy--"years ago", unknown laterality, no treatment 01/07/2015  . Allergy to penicillin 01/07/2015  . Migraines 01/07/2015  . Ectopic pregnancy 01/08/15--removal left tube 01/07/2015    Chief Complaint  Patient presents with  . Abdominal Cramping   HPI:  See above  OB History    Gravida Para Term Preterm AB TAB SAB Ectopic Multiple Living   0 2  0      Past Medical History  Diagnosis Date  . Medical history non-contributory   . Headache   . Anxiety     Past Surgical History  Procedure Laterality Date  . Laparoscopy N/A 01/07/2015    Procedure: LAPAROSCOPY DIAGNOSTIC WITH REMOVAL OF ECTOPIC PREGNANCY;  Surgeon: Jaymes Graff, MD;  Location: [facility];  Service: Gynecology;  Laterality: N/A;  . Unilateral salpingectomy Left 01/07/2015    Procedure: LEFT SALPINGECTOMY;  Surgeon: Jaymes Graff, MD;  Location: [facility];  Service: Gynecology;  Laterality: Left;  . Therapeutic abortion      Family History  Problem Relation Age of Onset  . Diabetes Father     History   Substance Use Topics  . Smoking status: Never Smoker   . Smokeless tobacco: Never Used  . Alcohol Use: No     Comment: occ wine    Allergies:  Allergies  Allergen Reactions  . Penicillins Hives    No prescriptions prior to admission    ROS:  Dysmenorrhea, menstrual flow Physical Exam   Blood pressure 129/78, pulse 84, temperature 98.1 F (36.7 C), temperature source Oral, resp. rate 18, height  (1.651 m), weight 90.719 kg (200 lb), last menstrual period 07/31/2015.    Physical Exam  In NAD Chest clear Heart RRR without murmur Abd soft, NT Pelvic--WNL, small amount menstrual flow Ext WNL  Results for orders placed or performed during the hospital encounter of 08/01/15 (from the past 24 hour(s))  Pregnancy, urine POC     Status: None   Collection Time: 08/01/15  8:48 AM  Result Value Ref Range   Preg Test, Ur NEGATIVE NEGATIVE    ED Course  Assessment Recurrent dysmenorrhea Hx 2 ectopic pregnancies  Plan: Reviewed issue of dysmenorrhea, with comfort measures reviewed. Advised patient therapies for dysmenorrhea are limited due to desire for conception. Rx Vicodin, 1 po q 4 hours prn dysmenorrhea, #36, no refills. Continue Ibuprophen 800 mg po q 8 hours x 24 hours, then prn. F/u with office if needed.   Nigel Bridgeman CNM, MSN 08/01/2015 9:23 AM

## 2015-08-01 NOTE — Discharge Instructions (Signed)

## 2015-09-13 ENCOUNTER — Encounter (HOSPITAL_COMMUNITY): Payer: Self-pay

## 2015-09-13 ENCOUNTER — Observation Stay (HOSPITAL_COMMUNITY)
Admission: AD | Admit: 2015-09-13 | Discharge: 2015-09-14 | Disposition: A | Discharge status: Home / Self Care | Payer: 59 | Source: Ambulatory Visit | Attending: Obstetrics & Gynecology | Admitting: Obstetrics & Gynecology

## 2015-09-13 DIAGNOSIS — O209 Hemorrhage in early pregnancy, unspecified: Secondary | ICD-10-CM

## 2015-09-13 DIAGNOSIS — O001 Tubal pregnancy: Secondary | ICD-10-CM | POA: Diagnosis not present

## 2015-09-13 DIAGNOSIS — O009 Unspecified ectopic pregnancy without intrauterine pregnancy: Secondary | ICD-10-CM | POA: Diagnosis present

## 2015-09-13 LAB — URINALYSIS, ROUTINE W REFLEX MICROSCOPIC
Bilirubin Urine: NEGATIVE
GLUCOSE, UA: NEGATIVE mg/dL
Ketones, ur: NEGATIVE mg/dL
LEUKOCYTES UA: NEGATIVE
NITRITE: NEGATIVE
PROTEIN: NEGATIVE mg/dL
Specific Gravity, Urine: 1.02 (ref 1.005–1.030)
Urobilinogen, UA: 0.2 mg/dL (ref 0.0–1.0)
pH: 6 (ref 5.0–8.0)

## 2015-09-13 LAB — CBC
HCT: 40.3 % (ref 36.0–46.0)
Hemoglobin: 14.7 g/dL (ref 12.0–15.0)
MCH: 32.7 pg (ref 26.0–34.0)
MCHC: 36.5 g/dL — ABNORMAL HIGH (ref 30.0–36.0)
MCV: 89.6 fL (ref 78.0–100.0)
Platelets: 212 10*3/uL (ref 150–400)
RBC: 4.5 MIL/uL (ref 3.87–5.11)
RDW: 13 % (ref 11.5–15.5)
WBC: 6.8 10*3/uL (ref 4.0–10.5)

## 2015-09-13 LAB — URINE MICROSCOPIC-ADD ON

## 2015-09-13 LAB — POCT PREGNANCY, URINE: Preg Test, Ur: POSITIVE — AB

## 2015-09-13 LAB — HCG, QUANTITATIVE, PREGNANCY: HCG, BETA CHAIN, QUANT, S: 17201 m[IU]/mL — AB (ref <5)

## 2015-09-13 NOTE — Progress Notes (Signed)
Called to notify of arrival in MAU and complaint. Will come see pt

## 2015-09-13 NOTE — MAU Note (Signed)
Pt reports blood in her urine, positive preg test yesterday. Reports lower abd pain since 09/08.

## 2015-09-13 NOTE — MAU Provider Note (Signed)
History    Latoya Ibarra is a 33y.o. G4P0 at reported 5.3wks by LMP.  Patient reports that she has been having prolonged bleeding since August and was seen in the office yesterday.  Patient states, that at that time, she was told to discontinue pain medications and monitor bleeding.  Patient further instructed to report to MAU if pain started or bleeding increased.  Patient states she has been saturating through one pad every hour.  Patient reports pain currently 8/10 and "nothing helps it."  Patient denies issues with constipation, diarrhea, and urination.  Patient with history of ectopic pregnancy x 2 with left salpingectomy.   Patient Active Problem List   Diagnosis Date Noted  . Hx of ectopic pregnancy--"years ago", unknown laterality, no treatment 01/07/2015  . Allergy to penicillin 01/07/2015  . Migraines 01/07/2015  . Ectopic pregnancy 01/08/15--removal left tube 01/07/2015    No chief complaint on file.  HPI  OB History    Gravida Para Term Preterm AB TAB SAB Ectopic Multiple Living   0 2  0      Past Medical History  Diagnosis Date  . Medical history non-contributory   . Headache   . Anxiety     Past Surgical History  Procedure Laterality Date  . Laparoscopy N/A 01/07/2015    Procedure: LAPAROSCOPY DIAGNOSTIC WITH REMOVAL OF ECTOPIC PREGNANCY;  Surgeon: Jaymes Graff, MD;  Location: [facility];  Service: Gynecology;  Laterality: N/A;  . Unilateral salpingectomy Left 01/07/2015    Procedure: LEFT SALPINGECTOMY;  Surgeon: Jaymes Graff, MD;  Location: [facility];  Service: Gynecology;  Laterality: Left;  . Therapeutic abortion      Family History  Problem Relation Age of Onset  . Diabetes Father     Social History  Substance Use Topics  . Smoking status: Never Smoker   . Smokeless tobacco: Never Used  . Alcohol Use: No     Comment: occ wine    Allergies:  Allergies  Allergen Reactions  . Penicillins Hives    Has patient had a PCN reaction causing  immediate rash, facial/tongue/throat swelling, SOB or lightheadedness with hypotension: No Has patient had a PCN reaction causing severe rash involving mucus membranes or skin necrosis: No Has patient had a PCN reaction that required hospitalization No Has patient had a PCN reaction occurring within the last 10 years: No If all of the above answers are "NO", then may proceed with Cephalosporin use.      Prescriptions prior to admission  Medication Sig Dispense Refill Last Dose  . amitriptyline (ELAVIL) 25 MG tablet Take 25 mg by mouth at bedtime.   09/11/2015  . Prenatal Vit-Fe Fumarate-FA (PRENATAL MULTIVITAMIN) TABS tablet Take 1 tablet by mouth daily at 12 noon.   09/11/2015  . zolpidem (AMBIEN) 10 MG tablet Take 10 mg by mouth at bedtime as needed for sleep.    09/11/2015    ROS  See HPI Above Physical Exam   Blood pressure 135/85, pulse 99, temperature 98.8 F (37.1 C), temperature source Oral, resp. rate 18, height  (1.676 m), weight 90.719 kg (200 lb), last menstrual period 08/06/2015, SpO2 100 %.  Results for orders placed or performed during the hospital encounter of 09/13/15 (from the past 24 hour(s))  Urinalysis, Routine w reflex microscopic (not at [facility])     Status: Abnormal   Collection Time: 09/13/15  8:10 PM  Result Value Ref Range   Color, Urine YELLOW YELLOW   APPearance  CLEAR CLEAR   Specific Gravity, Urine 1.020 1.005 - 1.030   pH 6.0 5.0 - 8.0   Glucose, UA NEGATIVE NEGATIVE mg/dL   Hgb urine dipstick LARGE (A) NEGATIVE   Bilirubin Urine NEGATIVE NEGATIVE   Ketones, ur NEGATIVE NEGATIVE mg/dL   Protein, ur NEGATIVE NEGATIVE mg/dL   Urobilinogen, UA 0.2 0.0 - 1.0 mg/dL   Nitrite NEGATIVE NEGATIVE   Leukocytes, UA NEGATIVE NEGATIVE  Urine microscopic-add on     Status: Abnormal   Collection Time: 09/13/15  8:10 PM  Result Value Ref Range   Squamous Epithelial / LPF RARE RARE   WBC, UA 0-2 <3 WBC/hpf   RBC / HPF 7-10 <3 RBC/hpf   Bacteria, UA FEW (A)  RARE  Pregnancy, urine POC     Status: Abnormal   Collection Time: 09/13/15  8:36 PM  Result Value Ref Range   Preg Test, Ur POSITIVE (A) NEGATIVE  CBC     Status: Abnormal   Collection Time: 09/13/15 10:40 PM  Result Value Ref Range   WBC 6.8 4.0 - 10.5 K/uL   RBC 4.50 3.87 - 5.11 MIL/uL   Hemoglobin 14.7 12.0 - 15.0 g/dL   HCT 91.4 78.2 - 95.6 %   MCV 89.6 78.0 - 100.0 fL   MCH 32.7 26.0 - 34.0 pg   MCHC 36.5 (H) 30.0 - 36.0 g/dL   RDW 21.3 08.6 - 57.8 %   Platelets 212 150 - 400 K/uL    Physical Exam  Constitutional: She is oriented to person, place, and time. She appears well-developed and well-nourished. No distress.  HENT:  Head: Normocephalic and atraumatic.  Eyes: EOM are normal. Pupils are equal, round, and reactive to light.  Neck: Normal range of motion.  Cardiovascular: Normal rate, regular rhythm and normal heart sounds.   Respiratory: Effort normal and breath sounds normal.  GI: Soft. Bowel sounds are normal. She exhibits no distension and no mass. There is tenderness. There is no rebound and no guarding.  Genitourinary: Uterus is not enlarged. Cervix exhibits no motion tenderness and no discharge. No bleeding in the vagina. No vaginal discharge found.  Speculum Exam: -Vaginal Vault: Scant amt brownish red blood noted  -Cervix: Appears closed, no discharge from os Bimanual Exam: Closed/Long.  Unable to palpate adnexa due to bilateral and suprapubic discomfort  Musculoskeletal: Normal range of motion. She exhibits no edema.  Neurological: She is alert and oriented to person, place, and time.  Skin: Skin is warm and dry.    ED Course  Assessment: +UPT Bleeding Bilateral Lower Abdominal Pain H/O Ectopic Pregnancy  Plan: -Dr. Lance Morin consulted regarding POC, advised as below: -PE; completed as above -[place] to confirm viable IUP -Labs: UA, Progesterone, bHcG, CBC -Patient informed pain medication will be provided after [place]   Follow Up (0139) -Radiologist  calls reports [place] significant for right side ectopic pregnancy -Dr. Delrae Sawyers consulted and advised: -Mtx vs Laparoscopic removal of ectopic  -Regardless of decision, admit for observation -In room to discuss options with patient--opts for laparoscopy  -Dr. Delrae Sawyers updated -Will attempt to schedule for early AM -OR called and reports no appts prior to 10am -Dr. Kathie Rhodes. [place] office scheduled reviewed and appts noted from 9am-1415pm   Follow Up (0219) -In room to discuss inability to schedule early appt for surgery -Patient requests clarification on methods -Informed of risk associated with each method  --Mtx; If successful future pregnancies possible, may require 2 doses, may be unsuccessful --Lap; If successful future pregnancies  possible, removal of tube is risk, unknowns until surgery taking place -Patient thankful for further explanations, but reports uncertainty with both processes -Informed that she will be admitted for observation -NPO at 0600 -Start IV -CMP and T&S -Will admit and await patient choice regarding method  EMLY, JESSICA LYNN CNM, MSN 09/13/2015 10:10 PM

## 2015-09-14 ENCOUNTER — Inpatient Hospital Stay (HOSPITAL_COMMUNITY): Payer: 59

## 2015-09-14 ENCOUNTER — Observation Stay (HOSPITAL_COMMUNITY): Payer: 59 | Admitting: Anesthesiology

## 2015-09-14 ENCOUNTER — Encounter (HOSPITAL_COMMUNITY): Payer: Self-pay | Admitting: *Deleted

## 2015-09-14 ENCOUNTER — Encounter (HOSPITAL_COMMUNITY)
Admission: AD | Disposition: A | Discharge status: Home / Self Care | Payer: Self-pay | Source: Ambulatory Visit | Attending: Obstetrics & Gynecology

## 2015-09-14 HISTORY — PX: LAPAROSCOPY: SHX197

## 2015-09-14 LAB — COMPREHENSIVE METABOLIC PANEL
ALT: 16 U/L (ref 14–54)
ANION GAP: 7 (ref 5–15)
AST: 17 U/L (ref 15–41)
Albumin: 4.1 g/dL (ref 3.5–5.0)
Alkaline Phosphatase: 49 U/L (ref 38–126)
BILIRUBIN TOTAL: 0.7 mg/dL (ref 0.3–1.2)
BUN: 13 mg/dL (ref 6–20)
CO2: 24 mmol/L (ref 22–32)
Calcium: 8.9 mg/dL (ref 8.9–10.3)
Chloride: 105 mmol/L (ref 101–111)
Creatinine, Ser: 0.89 mg/dL (ref 0.44–1.00)
GFR calc Af Amer: >60 mL/min (ref >60)
Glucose, Bld: 110 mg/dL — ABNORMAL HIGH (ref 65–99)
POTASSIUM: 3.6 mmol/L (ref 3.5–5.1)
Sodium: 136 mmol/L (ref 135–145)
TOTAL PROTEIN: 7.1 g/dL (ref 6.5–8.1)

## 2015-09-14 LAB — SURGICAL PCR SCREEN
MRSA, PCR: NEGATIVE
Staphylococcus aureus: NEGATIVE

## 2015-09-14 LAB — TYPE AND SCREEN
ABO/RH(D): A POS
ANTIBODY SCREEN: NEGATIVE

## 2015-09-14 SURGERY — LAPAROSCOPY, DIAGNOSTIC
Anesthesia: General | Site: Abdomen | Laterality: Right

## 2015-09-14 MED ORDER — MEPERIDINE HCL 25 MG/ML IJ SOLN: 6.2500 mg | INTRAMUSCULAR | Status: DC | PRN

## 2015-09-14 MED ORDER — DEXAMETHASONE SODIUM PHOSPHATE 4 MG/ML IJ SOLN
INTRAMUSCULAR | Status: AC
  Filled 2015-09-14: qty 1

## 2015-09-14 MED ORDER — LIDOCAINE HCL (CARDIAC) 20 MG/ML IV SOLN
INTRAVENOUS | Status: DC | PRN
  Administered 2015-09-14: 80 mg via INTRAVENOUS

## 2015-09-14 MED ORDER — METOCLOPRAMIDE HCL 5 MG/ML IJ SOLN
10.0000 mg | Freq: Once | INTRAMUSCULAR | Status: AC | PRN
  Administered 2015-09-14: 10 mg via INTRAVENOUS

## 2015-09-14 MED ORDER — ONDANSETRON HCL 4 MG PO TABS: 4.0000 mg | ORAL_TABLET | Freq: Four times a day (QID) | ORAL | Status: DC | PRN

## 2015-09-14 MED ORDER — METHYLENE BLUE 1 % INJ SOLN
INTRAMUSCULAR | Status: AC
  Filled 2015-09-14: qty 1

## 2015-09-14 MED ORDER — TRIAMCINOLONE ACETONIDE 40 MG/ML IJ SUSP: 11.0000 mL | Freq: Once | INTRAMUSCULAR | Status: DC

## 2015-09-14 MED ORDER — ROCURONIUM BROMIDE 100 MG/10ML IV SOLN
INTRAVENOUS | Status: DC | PRN
  Administered 2015-09-14 (×3): 5 mg via INTRAVENOUS
  Administered 2015-09-14: 35 mg via INTRAVENOUS

## 2015-09-14 MED ORDER — GLYCOPYRROLATE 0.2 MG/ML IJ SOLN
INTRAMUSCULAR | Status: AC
  Filled 2015-09-14: qty 1

## 2015-09-14 MED ORDER — ONDANSETRON HCL 4 MG/2ML IJ SOLN
4.0000 mg | Freq: Four times a day (QID) | INTRAMUSCULAR | Status: DC | PRN
  Administered 2015-09-14: 4 mg via INTRAVENOUS

## 2015-09-14 MED ORDER — SODIUM CHLORIDE 0.9 % IV SOLN
INTRAVENOUS | Status: DC | PRN
  Administered 2015-09-14: 120 mL

## 2015-09-14 MED ORDER — BUPIVACAINE HCL (PF) 0.25 % IJ SOLN
INTRAMUSCULAR | Status: AC
  Filled 2015-09-14: qty 30

## 2015-09-14 MED ORDER — METOCLOPRAMIDE HCL 5 MG/ML IJ SOLN
INTRAMUSCULAR | Status: AC
  Filled 2015-09-14: qty 2

## 2015-09-14 MED ORDER — VASOPRESSIN 20 UNIT/ML IV SOLN
INTRAVENOUS | Status: DC | PRN
  Administered 2015-09-14: 20 [IU] via SUBCUTANEOUS

## 2015-09-14 MED ORDER — AMITRIPTYLINE HCL 25 MG PO TABS: 25.0000 mg | ORAL_TABLET | Freq: Every day | ORAL | Status: DC

## 2015-09-14 MED ORDER — DEXAMETHASONE SODIUM PHOSPHATE 10 MG/ML IJ SOLN
INTRAMUSCULAR | Status: DC | PRN
  Administered 2015-09-14: 4 mg via INTRAVENOUS

## 2015-09-14 MED ORDER — HYDROMORPHONE HCL 1 MG/ML IJ SOLN
INTRAMUSCULAR | Status: AC
  Administered 2015-09-14: 0.25 mg via INTRAVENOUS
  Filled 2015-09-14: qty 1

## 2015-09-14 MED ORDER — LACTATED RINGERS IR SOLN
Status: DC | PRN
  Administered 2015-09-14 (×2): 3000 mL

## 2015-09-14 MED ORDER — PROPOFOL 10 MG/ML IV BOLUS
INTRAVENOUS | Status: AC
  Filled 2015-09-14: qty 20

## 2015-09-14 MED ORDER — SODIUM CHLORIDE 0.9 % IJ SOLN
INTRAMUSCULAR | Status: AC
  Filled 2015-09-14: qty 10

## 2015-09-14 MED ORDER — GLYCOPYRROLATE 0.2 MG/ML IJ SOLN
INTRAMUSCULAR | Status: DC | PRN
  Administered 2015-09-14: 0.4 mg via INTRAVENOUS
  Administered 2015-09-14: 0.2 mg via INTRAVENOUS

## 2015-09-14 MED ORDER — CLINDAMYCIN PHOSPHATE 900 MG/50ML IV SOLN
900.0000 mg | Freq: Once | INTRAVENOUS | Status: AC
  Administered 2015-09-14: 900 mg via INTRAVENOUS
  Filled 2015-09-14: qty 50

## 2015-09-14 MED ORDER — MIDAZOLAM HCL 2 MG/2ML IJ SOLN
INTRAMUSCULAR | Status: AC
  Filled 2015-09-14: qty 4

## 2015-09-14 MED ORDER — ONDANSETRON HCL 4 MG/2ML IJ SOLN
INTRAMUSCULAR | Status: AC
  Filled 2015-09-14: qty 2

## 2015-09-14 MED ORDER — HYDROMORPHONE HCL 1 MG/ML IJ SOLN
0.5000 mg | INTRAMUSCULAR | Status: DC | PRN
  Administered 2015-09-14 (×2): 0.5 mg via INTRAVENOUS
  Filled 2015-09-14 (×2): qty 1

## 2015-09-14 MED ORDER — VASOPRESSIN 20 UNIT/ML IV SOLN
INTRAVENOUS | Status: AC
  Filled 2015-09-14: qty 1

## 2015-09-14 MED ORDER — SODIUM CHLORIDE 0.9 % IJ SOLN
INTRAMUSCULAR | Status: DC | PRN
  Administered 2015-09-14: 50 mL

## 2015-09-14 MED ORDER — OXYCODONE-ACETAMINOPHEN 5-325 MG PO TABS
ORAL_TABLET | ORAL | Status: AC
  Filled 2015-09-14: qty 1

## 2015-09-14 MED ORDER — OXYCODONE-ACETAMINOPHEN 5-325 MG PO TABS: 1.0000 | ORAL_TABLET | ORAL | Status: DC | PRN

## 2015-09-14 MED ORDER — SODIUM CHLORIDE 0.9 % IJ SOLN
INTRAMUSCULAR | Status: AC
  Filled 2015-09-14: qty 50

## 2015-09-14 MED ORDER — HYDROMORPHONE HCL 1 MG/ML IJ SOLN
0.2500 mg | INTRAMUSCULAR | Status: DC | PRN
  Administered 2015-09-14: 0.25 mg via INTRAVENOUS
  Administered 2015-09-14: 0.5 mg via INTRAVENOUS
  Administered 2015-09-14: 0.25 mg via INTRAVENOUS

## 2015-09-14 MED ORDER — HEPARIN SODIUM (PORCINE) 5000 UNIT/ML IJ SOLN
INTRAMUSCULAR | Status: AC
  Filled 2015-09-14: qty 1

## 2015-09-14 MED ORDER — TRIAMCINOLONE ACETONIDE 40 MG/ML IJ SUSP
40.0000 mg | Freq: Once | INTRAMUSCULAR | Status: DC
  Filled 2015-09-14: qty 1

## 2015-09-14 MED ORDER — LACTATED RINGERS IV SOLN
INTRAVENOUS | Status: DC
  Administered 2015-09-14 (×4): via INTRAVENOUS

## 2015-09-14 MED ORDER — FENTANYL CITRATE (PF) 250 MCG/5ML IJ SOLN
INTRAMUSCULAR | Status: AC
  Filled 2015-09-14: qty 25

## 2015-09-14 MED ORDER — OXYCODONE-ACETAMINOPHEN 5-325 MG PO TABS
1.0000 | ORAL_TABLET | ORAL | Status: DC | PRN
  Administered 2015-09-14: 1 via ORAL

## 2015-09-14 MED ORDER — TRIAMCINOLONE ACETONIDE 40 MG/ML IJ SUSP
INTRAMUSCULAR | Status: DC | PRN
  Administered 2015-09-14: 40 mg

## 2015-09-14 MED ORDER — KETOROLAC TROMETHAMINE 30 MG/ML IJ SOLN
INTRAMUSCULAR | Status: AC
  Filled 2015-09-14: qty 2

## 2015-09-14 MED ORDER — KETOROLAC TROMETHAMINE 30 MG/ML IJ SOLN
INTRAMUSCULAR | Status: DC | PRN
  Administered 2015-09-14: 30 mg via INTRAVENOUS
  Administered 2015-09-14: 30 mg via INTRAMUSCULAR

## 2015-09-14 MED ORDER — FENTANYL CITRATE (PF) 100 MCG/2ML IJ SOLN
INTRAMUSCULAR | Status: DC | PRN
  Administered 2015-09-14: 100 ug via INTRAVENOUS
  Administered 2015-09-14 (×2): 25 ug via INTRAVENOUS
  Administered 2015-09-14: 100 ug via INTRAVENOUS

## 2015-09-14 MED ORDER — ROPIVACAINE HCL 5 MG/ML IJ SOLN
INTRAMUSCULAR | Status: AC
Start: 2015-09-14 — End: 2015-09-14
  Filled 2015-09-14: qty 60

## 2015-09-14 MED ORDER — LIDOCAINE HCL (CARDIAC) 20 MG/ML IV SOLN
INTRAVENOUS | Status: AC
  Filled 2015-09-14: qty 5

## 2015-09-14 MED ORDER — IBUPROFEN 600 MG PO TABS: 600.0000 mg | ORAL_TABLET | Freq: Four times a day (QID) | ORAL | Status: DC | PRN

## 2015-09-14 MED ORDER — NEOSTIGMINE METHYLSULFATE 10 MG/10ML IV SOLN
INTRAVENOUS | Status: AC
  Filled 2015-09-14: qty 1

## 2015-09-14 MED ORDER — PROPOFOL 10 MG/ML IV BOLUS
INTRAVENOUS | Status: DC | PRN
  Administered 2015-09-14: 170 mg via INTRAVENOUS
  Administered 2015-09-14: 30 mg via INTRAVENOUS

## 2015-09-14 MED ORDER — MIDAZOLAM HCL 2 MG/2ML IJ SOLN
INTRAMUSCULAR | Status: DC | PRN
  Administered 2015-09-14: 2 mg via INTRAVENOUS

## 2015-09-14 MED ORDER — NEOSTIGMINE METHYLSULFATE 10 MG/10ML IV SOLN
INTRAVENOUS | Status: DC | PRN
  Administered 2015-09-14 (×2): 2 mg via INTRAVENOUS

## 2015-09-14 MED ORDER — ROCURONIUM BROMIDE 100 MG/10ML IV SOLN
INTRAVENOUS | Status: AC
  Filled 2015-09-14: qty 1

## 2015-09-14 MED ORDER — PRENATAL MULTIVITAMIN CH: 1.0000 | ORAL_TABLET | Freq: Every day | ORAL | Status: DC

## 2015-09-14 SURGICAL SUPPLY — 28 items
APPLICATOR COTTON TIP 6IN STRL (MISCELLANEOUS) ×3 IMPLANT
CABLE HIGH FREQUENCY MONO STRZ (ELECTRODE) IMPLANT
CATH ROBINSON RED A/P 16FR (CATHETERS) ×3 IMPLANT
CHLORAPREP W/TINT 26ML (MISCELLANEOUS) ×3 IMPLANT
CLOTH BEACON ORANGE TIMEOUT ST (SAFETY) ×3 IMPLANT
DRSG COVADERM PLUS 2X2 (GAUZE/BANDAGES/DRESSINGS) ×6 IMPLANT
DRSG OPSITE POSTOP 3X4 (GAUZE/BANDAGES/DRESSINGS) IMPLANT
FORCEPS CUTTING 33CM 5MM (CUTTING FORCEPS) IMPLANT
GLOVE BIOGEL PI IND STRL 7.0 (GLOVE) ×1 IMPLANT
GLOVE BIOGEL PI INDICATOR 7.0 (GLOVE) ×2
GLOVE ECLIPSE 6.5 STRL STRAW (GLOVE) ×3 IMPLANT
GOWN STRL REUS W/TWL LRG LVL3 (GOWN DISPOSABLE) ×6 IMPLANT
LIQUID BAND (GAUZE/BANDAGES/DRESSINGS) ×2 IMPLANT
NDL SPNL 22GX7 QUINCKE BK (NEEDLE) IMPLANT
NEEDLE SPNL 22GX3.5 QUINCKE BK (NEEDLE) IMPLANT
NEEDLE SPNL 22GX7 QUINCKE BK (NEEDLE) ×3 IMPLANT
NS IRRIG 1000ML POUR BTL (IV SOLUTION) ×3 IMPLANT
PACK LAPAROSCOPY BASIN (CUSTOM PROCEDURE TRAY) ×3 IMPLANT
PAD POSITIONING PINK XL (MISCELLANEOUS) ×3 IMPLANT
SET IRRIG TUBING LAPAROSCOPIC (IRRIGATION / IRRIGATOR) IMPLANT
SLEEVE XCEL OPT CAN 5 100 (ENDOMECHANICALS) IMPLANT
SOLUTION ELECTROLUBE (MISCELLANEOUS) IMPLANT
SUT MNCRL AB 3-0 PS2 27 (SUTURE) ×3 IMPLANT
SUT VICRYL 0 UR6 27IN ABS (SUTURE) IMPLANT
TOWEL OR 17X24 6PK STRL BLUE (TOWEL DISPOSABLE) ×6 IMPLANT
TROCAR BALLN 12MMX100 BLUNT (TROCAR) ×3 IMPLANT
TROCAR XCEL NON-BLD 5MMX100MML (ENDOMECHANICALS) IMPLANT
WATER STERILE IRR 1000ML POUR (IV SOLUTION) ×3 IMPLANT

## 2015-09-14 NOTE — Anesthesia Procedure Notes (Signed)
Procedure Name: Intubation Date/Time: 09/14/2015 10:08 AM Performed by: Cephus Shelling A Pre-anesthesia Checklist: Patient identified, Emergency Drugs available, Suction available and Patient being monitored Patient Re-evaluated:Patient Re-evaluated prior to inductionOxygen Delivery Method: Circle system utilized Preoxygenation: Pre-oxygenation with 100% oxygen (modified rapid with cricoid pressure) Intubation Type: IV induction Ventilation: Mask ventilation without difficulty Laryngoscope Size: Mac and 3 Grade View: Grade II Tube size: 7.0 mm Number of attempts: 1 Airway Equipment and Method: Stylet Placement Confirmation: ETT inserted through vocal cords under direct vision,  positive ETCO2 and breath sounds checked- equal and bilateral Secured at: 20 cm Tube secured with: Tape Dental Injury: Teeth and Oropharynx as per pre-operative assessment

## 2015-09-14 NOTE — OR Nursing (Signed)
Pt arrived to OR desk via stretcher. She has dermal piercing in R upper lip and multiple piercings in L ear.  She states she is unable to remove any of the piercings. Discussed with pt risks assoc with metal/piercings on body and electrosurgery. Pt verbalized understanding of risks. Plan to leave/tape piercings in place.

## 2015-09-14 NOTE — Op Note (Signed)
Preoperative diagnosis: Suspected right ectopic pregnancy  Postoperative diagnosis: Right tubal pregnancy  Anesthesia: General  Anesthesiologist: Dr. Malen Gauze  Procedure: Laparoscopic treatment of right tubal pregnancy with salpyngostomy ( tubal preservation)  Surgeon: Dr. Dois Davenport Abbe Bula  Assistant: Henreitta Leber PA-C  Estimated blood loss: 50 cc  Procedure:  After being informed of the planned procedure with possible complications including bleeding, infection, injury to other organs, possible salpyngectomy,possible laparotomy,  informed consent is obtained and the patient is taken to OR # 7. She is placed in lithotomy position, prepped and draped in a sterile fashion, and a Foley catheter is inserted in her bladder.  Pelvic exam: Normal external genitalia, normal cervix, normal uterus, normal adnexa  A speculum is inserted in the vagina and the anterior lip of the cervix was grasped with tenaculum forcep. An acorn intrauterine manipulator is easily positioned and the speculum is removed.  We infiltrate the umbilical area using 10 cc of Marcaine 0.25 and perform a semi-elliptical incision, while excising the keloid scar,  which is brought down bluntly to the fascia. The fascia is identified and grasped with Coker forceps. The fascia is opened with Mayo scissors. Peritoneum is entered bluntly. A pursestring suture of 0 Vicryl is placed on the fascia. A 10 mm Hassan trocar is easily inserted in the abdominal cavity and is held in place both by the inflated trocar balloon and the previously placed pursestring suture. This allows for easy insufflation of the pneumoperitoneum using warm CO2 at a maximum pressure of 15 mmHg. A operative laparoscope is inserted in the abdominal cavity.  Observation: Anterior cul-de-sac is normal, posterior cul-de-sac is normal except for a small amount of bloob, uterus is normal. Right tube shows an ectopic pregnancy in the ampullary area without active bleeding and  with a normal appearing fimbriae, left tube is absent. Appendix is visualized and normal. 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 Gallbladder is visualized and normal.  We proceed with placement of two 5 mm trocars under direct visualization after infiltrating each side with 10 cc of Ropivacaine 0.25 %, one in the right lower quadrant and one in the left lower quadrant. 60 cc of Ropivacaine 0.25% is deposited in the pelvis.  We then infiltrate the tube's mesosalpynx with Vasopressine diluted 20 units in 50 cc of Saline. 12 cc was used.  Using monopolar tip on suction, we open the tube longitudinally on a 3 cm distance. Hydrodissection then delivers the pregnancy out of the tube. The specimen is removed form the abdomen through the umbilical trocar. We irrigate profusely with warm saline and complete hemostasis on the edges of the salpyngostomy with bipolar cauterization.   All instruments are then removed after evacuating the pneumoperitoneum. The fascia of the umbilical incision is closed with the purse string suture of 0 Vicryl. The skin of all incisions is closed with a subcuticular suture of 3-0 Monocryl and Dermabond. We infiltrate the umbilical incision with Kenalog 40 mg in 9 cc of Ropivacaine 0.25% to prevent future keloid.    Instrument and sponge count is complete x2. Estimated blood loss is 50 cc .The procedure is well tolerated by the patient is taken to recovery room in a well and stable condition.    Specimen: tubal pregnancy sent to pathology   [facility] A  MD

## 2015-09-14 NOTE — Transfer of Care (Signed)
Immediate Anesthesia Transfer of Care Note  Patient: Latoya Ibarra  Procedure(s) Performed: Procedure(s) with comments: LAPAROSCOPY TREATMENT OF ECTOPIC PREGNANCY WITH SALPINGOSTOMY (Right) - with removal of ectopic pregnancy   Patient Location: PACU  Anesthesia Type:General  Level of Consciousness: awake  Airway & Oxygen Therapy: Patient Spontanous Breathing  Post-op Assessment: Report given to PACU RN  Post vital signs: stable  Filed Vitals:   09/14/15 0951  BP: 127/71  Pulse: 84  Temp: 37.2 C  Resp: 18    Complications: No apparent anesthesia complications

## 2015-09-14 NOTE — OR Nursing (Signed)
Surgery completed.  Tape removed from piercings. All piercings intact. Pt w/out signs of injury/burn from metal remaining in body.

## 2015-09-14 NOTE — Discharge Instructions (Signed)
POST-OPERATIVE INSTRUCTIONS TO PATIENT  Call [facility]  [phone])  for excessive pain, bleeding or temperature greater than or equal to 100.4 degrees (orally).    No driving for 24 hours No lifting (more than 20 lbs) for 6 weeks No sexual activity until post-operative visit  Pain management:  Use Ibuprofen 600 mg every 6 hours for 5 days and then as needed. (MAY TAKE AFTER 5:53 PM) Use your pain medication as needed to maintain a pain level at or below 3/10 Use Colace 1-2 capsules per day as long as you are using pain medication to avoid constipation.       Diet: normal  Bathing: may shower day after surgery  Wound Care: keep incisions clean and dry  Return to the office for quantitative BHcG  ( lab work) on 09/16/15 and on 09/21/15 at 10:00 am  Return to Dr. Estanislado Pandy on 09/28/15 at 9:00  Return to work: 09/19/15    [facility] A MD 09/14/2015 9:39 AM

## 2015-09-14 NOTE — Anesthesia Postprocedure Evaluation (Signed)
Anesthesia Post Note  Patient: Latoya Ibarra  Procedure(s) Performed: Procedure(s) (LRB): LAPAROSCOPY TREATMENT OF ECTOPIC PREGNANCY WITH SALPINGOSTOMY (Right)  Anesthesia type: General  Patient location: PACU  Post pain: Pain level controlled  Post assessment: Post-op Vital signs reviewed  Last Vitals:  Filed Vitals:   09/14/15 1315  BP: 115/55  Pulse: 77  Temp:   Resp: 12    Post vital signs: Reviewed  Level of consciousness: sedated  Complications: No apparent anesthesia complications

## 2015-09-14 NOTE — H&P (Signed)
Latoya Ibarra is an 34 y.o. female. Patient presents with bilateral lower abdominal pain, with increased pain on lower right side.  Patient reports bleeding that started Sept 8th and has been ongoing.  Patient with positive UPT on 9/19 and initial bHcg of 13505.3 at that time.  Patient evaluated and found to have right side ectopic pregnancy.    Pertinent Gynecological History: Menses: flow is excessive with use of 6-9 pads or tampons on heaviest days and with severe dysmenorrhea Bleeding: dysfunctional uterine bleeding Contraception: none DES exposure: unknown Blood transfusions: none Sexually transmitted diseases: no past history Previous GYN Procedures: Laproscopic Salpingectomy  Last mammogram: N/A  Last pap: normal Date: 2015 OB History: G4, P0030   Menstrual History: Menarche age: 67  Patient's last menstrual period was 08/06/2015.    Past Medical History  Diagnosis Date  . Medical history non-contributory   . Headache   . Anxiety     Past Surgical History  Procedure Laterality Date  . Laparoscopy N/A 01/07/2015    Procedure: LAPAROSCOPY DIAGNOSTIC WITH REMOVAL OF ECTOPIC PREGNANCY;  Surgeon: Jaymes Graff, MD;  Location: [facility];  Service: Gynecology;  Laterality: N/A;  . Unilateral salpingectomy Left 01/07/2015    Procedure: LEFT SALPINGECTOMY;  Surgeon: Jaymes Graff, MD;  Location: [facility];  Service: Gynecology;  Laterality: Left;  . Therapeutic abortion      Family History  Problem Relation Age of Onset  . Diabetes Father     Social History:  reports that she has never smoked. She has never used smokeless tobacco. She reports that she does not drink alcohol or use illicit drugs.  Allergies:  Allergies  Allergen Reactions  . Penicillins Hives    Has patient had a PCN reaction causing immediate rash, facial/tongue/throat swelling, SOB or lightheadedness with hypotension: No Has patient had a PCN reaction causing severe rash involving mucus membranes or skin  necrosis: No Has patient had a PCN reaction that required hospitalization No Has patient had a PCN reaction occurring within the last 10 years: No If all of the above answers are "NO", then may proceed with Cephalosporin use.      Prescriptions prior to admission  Medication Sig Dispense Refill Last Dose  . amitriptyline (ELAVIL) 25 MG tablet Take 25 mg by mouth at bedtime.   09/11/2015  . Prenatal Vit-Fe Fumarate-FA (PRENATAL MULTIVITAMIN) TABS tablet Take 1 tablet by mouth daily at 12 noon.   09/11/2015  . zolpidem (AMBIEN) 10 MG tablet Take 10 mg by mouth at bedtime as needed for sleep.    09/11/2015    Review of Systems  Gastrointestinal: Negative.   Genitourinary: Negative.     Blood pressure 135/85, pulse 99, temperature 98.8 F (37.1 C), temperature source Oral, resp. rate 18, height  (1.676 m), weight 90.719 kg (200 lb), last menstrual period 08/06/2015, SpO2 100 %. Physical Exam   Constitutional: She is oriented to person, place, and time. She appears well-developed and well-nourished. No distress.  HENT:  Head: Normocephalic and atraumatic.  Eyes: EOM are normal. Pupils are equal, round, and reactive to light.  Neck: Normal range of motion.  Cardiovascular: Normal rate, regular rhythm and normal heart sounds.  Respiratory: Effort normal and breath sounds normal.  GI: Soft. Bowel sounds are normal. She exhibits no distension and no mass. There is tenderness. There is no rebound and no guarding.  Genitourinary: Uterus is not enlarged. Cervix exhibits no motion tenderness and no discharge. No bleeding in the vagina. No vaginal discharge found.  Speculum Exam: -Vaginal Vault: Scant amt brownish red blood noted  -Cervix: Appears closed, no discharge from os Bimanual Exam: Closed/Long. Unable to palpate adnexa due to bilateral and suprapubic discomfort  Musculoskeletal: Normal range of motion. She exhibits no edema.  Neurological: She is alert and oriented to person,  place, and time.  Skin: Skin is warm and dry.   Results for orders placed or performed during the hospital encounter of 09/13/15 (from the past 24 hour(s))  Urinalysis, Routine w reflex microscopic (not at [facility])     Status: Abnormal   Collection Time: 09/13/15  8:10 PM  Result Value Ref Range   Color, Urine YELLOW YELLOW   APPearance CLEAR CLEAR   Specific Gravity, Urine 1.020 1.005 - 1.030   pH 6.0 5.0 - 8.0   Glucose, UA NEGATIVE NEGATIVE mg/dL   Hgb urine dipstick LARGE (A) NEGATIVE   Bilirubin Urine NEGATIVE NEGATIVE   Ketones, ur NEGATIVE NEGATIVE mg/dL   Protein, ur NEGATIVE NEGATIVE mg/dL   Urobilinogen, UA 0.2 0.0 - 1.0 mg/dL   Nitrite NEGATIVE NEGATIVE   Leukocytes, UA NEGATIVE NEGATIVE  Urine microscopic-add on     Status: Abnormal   Collection Time: 09/13/15  8:10 PM  Result Value Ref Range   Squamous Epithelial / LPF RARE RARE   WBC, UA 0-2 <3 WBC/hpf   RBC / HPF 7-10 <3 RBC/hpf   Bacteria, UA FEW (A) RARE  Pregnancy, urine POC     Status: Abnormal   Collection Time: 09/13/15  8:36 PM  Result Value Ref Range   Preg Test, Ur POSITIVE (A) NEGATIVE  CBC     Status: Abnormal   Collection Time: 09/13/15 10:40 PM  Result Value Ref Range   WBC 6.8 4.0 - 10.5 K/uL   RBC 4.50 3.87 - 5.11 MIL/uL   Hemoglobin 14.7 12.0 - 15.0 g/dL   HCT 16.1 09.6 - 04.5 %   MCV 89.6 78.0 - 100.0 fL   MCH 32.7 26.0 - 34.0 pg   MCHC 36.5 (H) 30.0 - 36.0 g/dL   RDW 40.9 81.1 - 91.4 %   Platelets 212 150 - 400 K/uL  hCG, quantitative, pregnancy     Status: Abnormal   Collection Time: 09/13/15 10:40 PM  Result Value Ref Range   hCG, Beta Chain, Quant, S 17201 (H) <5 mIU/mL    US Ob Comp Less 14 Wks  09/14/2015   CLINICAL DATA:  34 year old female with positive HCG levels  EXAM: OBSTETRIC <14 WK [place] AND TRANSVAGINAL OB US  TECHNIQUE: Both transabdominal and transvaginal ultrasound examinations were performed for complete evaluation of the gestation as well as the maternal uterus, adnexal  regions, and pelvic cul-de-sac. Transvaginal technique was performed to assess early pregnancy.  COMPARISON:  Ultrasound dated 01/07/2015  FINDINGS: The uterus is anteverted and mildly heterogeneous in echotexture. There is a small focal fluid collection in the fundal uterus or fundal endometrium. No yolk sac or fetal pole identified.  The right ovary measures 3.6 x 2.2 x 2.8 cm. A hypoechoic structure with peripheral blood flow noted in the right ovary was compatible with a corpus luteum. There is a 2.8 x 1.6 x 1.4 cm structure with hypoechoic solid rim and cystic center medial to the right ovary. This structure appears to be separate from the right ovary is concerning for an ectopic pregnancy. Correlation with clinical exam and obstetrical consult is advised.  The left ovary measures 2.4 x 1.7 x 2.6 cm appears grossly unremarkable.  No significant free  fluid noted within the pelvis.  IMPRESSION: No definite intrauterine pregnancy identified.  Complex structure medial to the right ovary concerning for an ectopic pregnancy. Correlation with clinical exam and obstetrical consult is recommended.  Critical Value/emergent results were called by telephone at the time of interpretation on 09/14/2015 at 1:31 am to Nurse midwife [facility] , who verbally acknowledged these results.   Electronically Signed   By: Elgie Collard M.D.   On: 09/14/2015 01:33   US Ob Transvaginal  09/14/2015   CLINICAL DATA:  34 year old female with positive HCG levels  EXAM: OBSTETRIC <14 WK [place] AND TRANSVAGINAL OB US  TECHNIQUE: Both transabdominal and transvaginal ultrasound examinations were performed for complete evaluation of the gestation as well as the maternal uterus, adnexal regions, and pelvic cul-de-sac. Transvaginal technique was performed to assess early pregnancy.  COMPARISON:  Ultrasound dated 01/07/2015  FINDINGS: The uterus is anteverted and mildly heterogeneous in echotexture. There is a small focal fluid collection in the  fundal uterus or fundal endometrium. No yolk sac or fetal pole identified.  The right ovary measures 3.6 x 2.2 x 2.8 cm. A hypoechoic structure with peripheral blood flow noted in the right ovary was compatible with a corpus luteum. There is a 2.8 x 1.6 x 1.4 cm structure with hypoechoic solid rim and cystic center medial to the right ovary. This structure appears to be separate from the right ovary is concerning for an ectopic pregnancy. Correlation with clinical exam and obstetrical consult is advised.  The left ovary measures 2.4 x 1.7 x 2.6 cm appears grossly unremarkable.  No significant free fluid noted within the pelvis.  IMPRESSION: No definite intrauterine pregnancy identified.  Complex structure medial to the right ovary concerning for an ectopic pregnancy. Correlation with clinical exam and obstetrical consult is recommended.  Critical Value/emergent results were called by telephone at the time of interpretation on 09/14/2015 at 1:31 am to Nurse midwife [facility] , who verbally acknowledged these results.   Electronically Signed   By: Elgie Collard M.D.   On: 09/14/2015 01:33    Assessment: Ectopic Pregnancy-Right Side H/O Left Salpingectomy  Plan: Admit to [facility] for observation Routine General Gyn Orders Allow patient time to decide for MTX injection or Laparoscopic removal NPO at 0600 Labs ordered Dilaudid IV for pain prn Will update Dr. Kris Mouton, Laveda Demedeiros [facility] MSN, CNM 09/14/2015, 2:16 AM

## 2015-09-14 NOTE — Interval H&P Note (Signed)
History and Physical Interval Note:  09/14/2015 9:35 AM  Dana Allan  has presented today for surgery, with the diagnosis of ectopic right fallopian tube   The various methods of treatment have been discussed with the patient and family. After consideration of risks, benefits and other options for treatment, the patient has consented to  Procedure(s) with comments: LAPAROSCOPY DIAGNOSTIC (Right) - with removal of ectopic pregnancy  as a surgical intervention .  The patient's history has been reviewed, patient examined, no change in status, stable for surgery.  I have reviewed the patient's chart and labs.  Questions were answered to the patient's satisfaction.    Lengthy discussion with patient and husband reviewing:  1. Etiology of ectopic pregnancy  2. Treatment options: methotrexate, conservative surgery with tubal preservation,  laparoscopic tubal removal with all risks and benefits  3. 94 % success rate of medical and conservative surgical treatment.  4. Need to follow decline of quantitative HCG to confirm success  5. Risk of recurrent ectopic pregnancy with future pregnancies of at least 25%  With previous ruptured ectopic in January 2016, patient is requesting surgical management. Procedure, R & B reviewed including but not limited to bleeding, infection, injury to other organs, need for salpyngectomy although our plan is tubal preservation, need for blood transfusion and possible need for laparotomy. All questions answered. Post-op recovery and instructions reviewed.    RIVARD,SANDRA A

## 2015-09-14 NOTE — MAU Note (Signed)
Report called to Debbie RN on Women's Unit. Will go to [phone]

## 2015-09-14 NOTE — Anesthesia Preprocedure Evaluation (Signed)
Anesthesia Evaluation  Patient identified by MRN, date of birth, ID band Patient awake    Reviewed: Allergy & Precautions, NPO status , Patient's Chart, lab work & pertinent test results  Airway Mallampati: III  TM Distance: >3 FB Neck ROM: Full    Dental no notable dental hx. (+) Teeth Intact   Pulmonary neg pulmonary ROS,    Pulmonary exam normal breath sounds clear to auscultation       Cardiovascular negative cardio ROS Normal cardiovascular exam Rhythm:Regular Rate:Normal     Neuro/Psych  Headaches, negative psych ROS   GI/Hepatic negative GI ROS, Neg liver ROS,   Endo/Other  Obesity  Renal/GU negative Renal ROS  negative genitourinary   Musculoskeletal negative musculoskeletal ROS (+)   Abdominal (+) + obese,   Peds  Hematology negative hematology ROS (+)   Anesthesia Other Findings   Reproductive/Obstetrics (+) Pregnancy Right ectopic pregnancy                             Anesthesia Physical Anesthesia Plan  ASA: II  Anesthesia Plan: General   Post-op Pain Management:    Induction: Intravenous, Rapid sequence and Cricoid pressure planned  Airway Management Planned: Oral ETT  Additional Equipment:   Intra-op Plan:   Post-operative Plan: Extubation in OR  Informed Consent: I have reviewed the patients History and Physical, chart, labs and discussed the procedure including the risks, benefits and alternatives for the proposed anesthesia with the patient or authorized representative who has indicated his/her understanding and acceptance.   Dental advisory given  Plan Discussed with: CRNA, Anesthesiologist and Surgeon  Anesthesia Plan Comments:         Anesthesia Quick Evaluation

## 2015-09-15 ENCOUNTER — Encounter (HOSPITAL_COMMUNITY): Payer: Self-pay | Admitting: Obstetrics and Gynecology

## 2015-09-15 LAB — PROGESTERONE: PROGESTERONE: 14.8 ng/mL

## 2015-10-31 ENCOUNTER — Other Ambulatory Visit: Payer: Self-pay | Admitting: Obstetrics and Gynecology

## 2015-11-01 NOTE — Discharge Summary (Signed)
Physician Discharge Summary  Patient ID: Latoya AllanDanielle Y Stone MRN: [id number] DOB/AGE: 33/22/1982 32 y.o.  Admit date: 09/13/2015 Discharge date: 11/01/2015   Discharge Diagnoses: Right Tubal Pregnancy Active Problems:   Ectopic pregnancy 01/08/15--removal left tube   Operation: Laparoscopic Removal of Right Tubal Pregnancy by Salpingostomy (tubal preservation)   Discharged Condition: Good  Hospital Course: On hospital day #2 the patient underwent the aforementioned procedure and tolerated it well.  Post operative course was unremarkable with the patient being discharged home from the post anesthesia unit. Discharge hemoglobin/hematocrit = 14.7/40.3.  Disposition: 01-Home or Self Care  Discharge Medications:    Medication List    TAKE these medications        amitriptyline 25 MG tablet  Commonly known as:  ELAVIL  Take 25 mg by mouth at bedtime.     ibuprofen 600 MG tablet  Commonly known as:  ADVIL,MOTRIN  Take 1 tablet (600 mg total) by mouth every 6 (six) hours as needed.     oxyCODONE-acetaminophen 5-325 MG tablet  Commonly known as:  ROXICET  Take 1 tablet by mouth every 4 (four) hours as needed for severe pain.     prenatal multivitamin Tabs tablet  Take 1 tablet by mouth daily at 12 noon.     zolpidem 10 MG tablet  Commonly known as:  AMBIEN  Take 10 mg by mouth at bedtime as needed for sleep.          Follow-up: Dr. Dois DavenportSandra A. Rivard 09/28/2015 at 9 a.m.   SignedHenreitta Leber: POWELL,ELMIRA, PA-C 11/01/2015, 5:12 PM

## 2015-11-12 IMAGING — US US OB COMP LESS 14 WK
1 series · 15 of 28 positions shown · non-contrast
Comparison: Ultrasound dated 01/07/2015

CLINICAL DATA: 38-year-old female with positive HCG levels

EXAM:
OBSTETRIC <14 WK US AND TRANSVAGINAL OB US
TECHNIQUE: Both transabdominal and transvaginal ultrasound examinations were
performed for complete evaluation of the gestation as well as the
maternal uterus, adnexal regions, and pelvic cul-de-sac.
Transvaginal technique was performed to assess early pregnancy.

[Series 1: us ob comp less 14 wk · 15 of 81 slices shown]
[im 1/81]
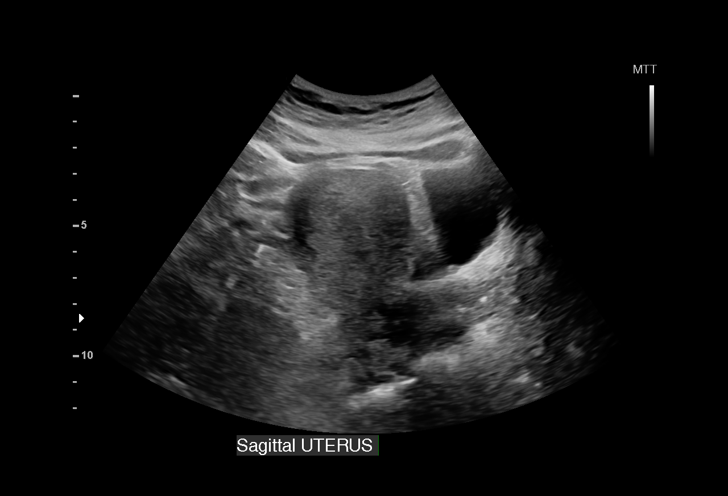
[im 6/81]
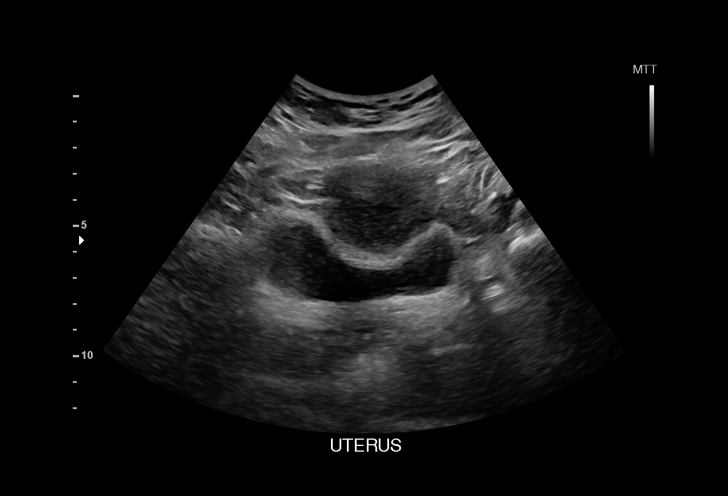
[im 12/81]
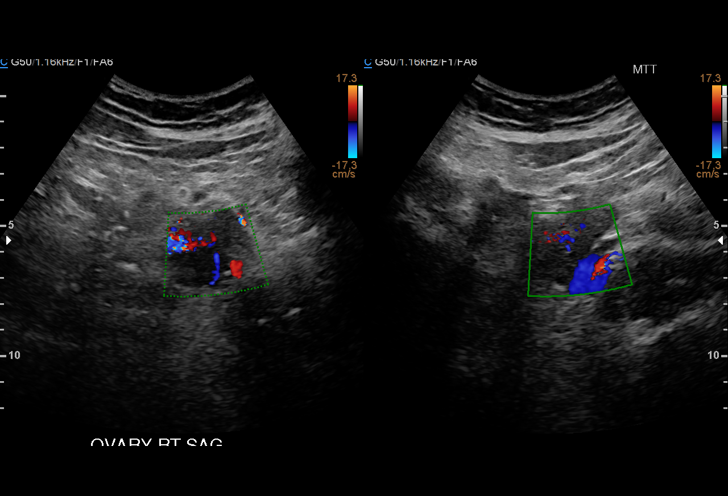
[im 18/81]
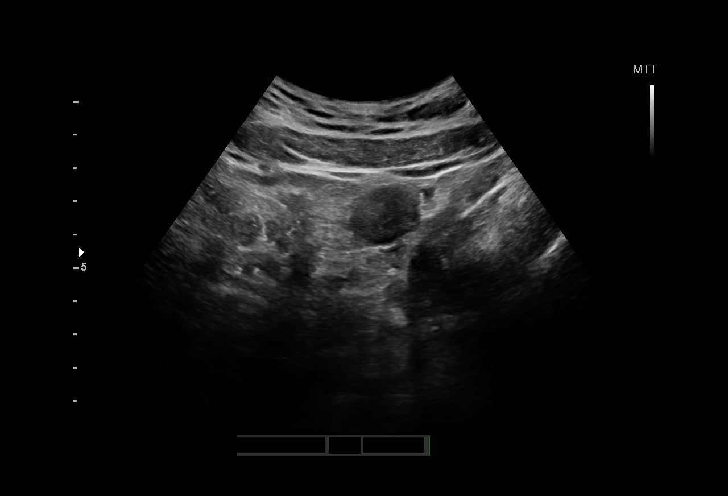
[im 24/81]
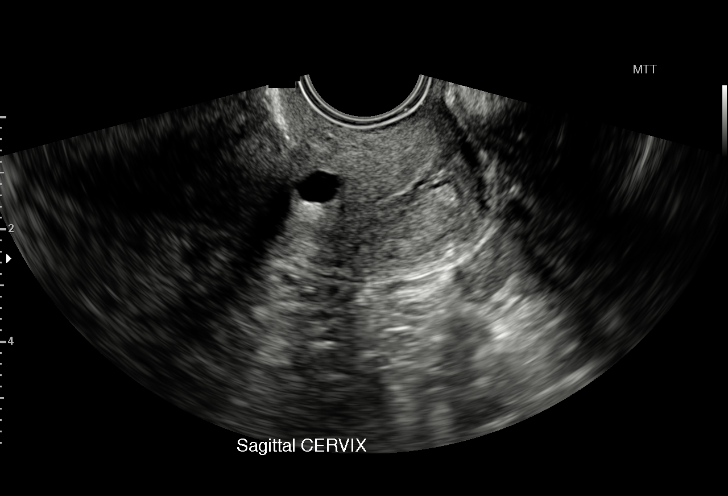
[im 30/81]
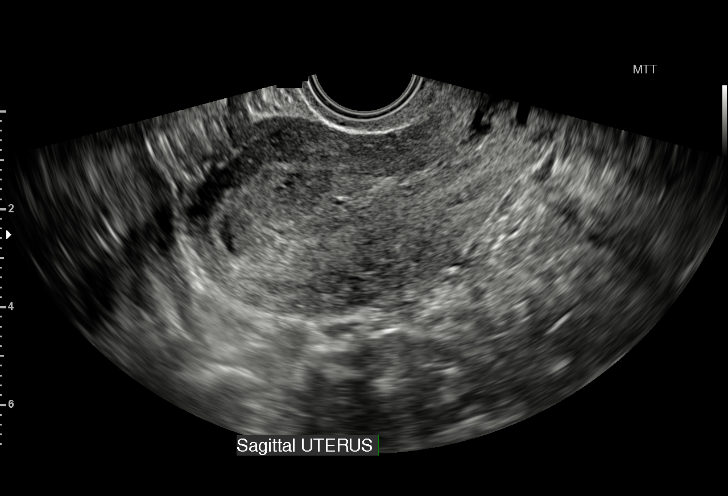
[im 36/81]
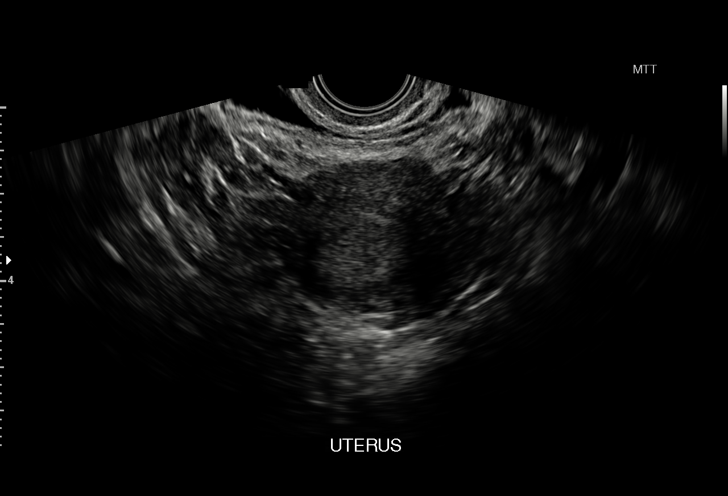
[im 42/81]
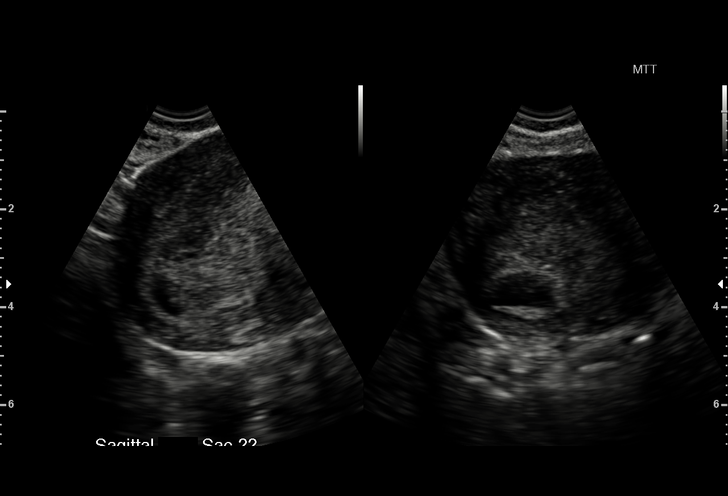
[im 45/81]
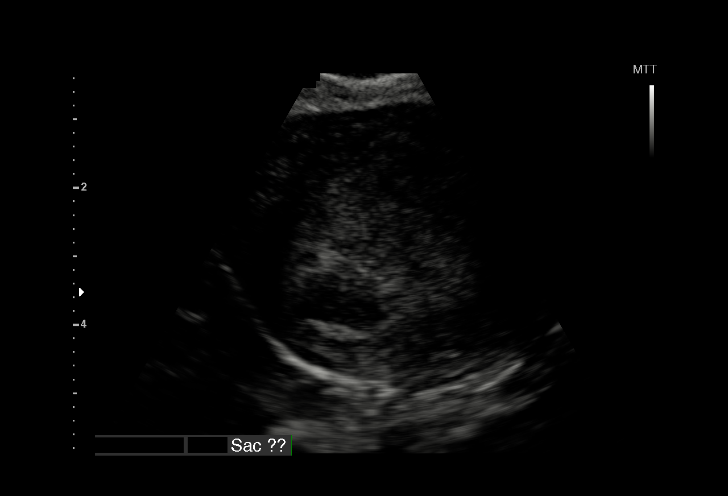
[im 51/81]
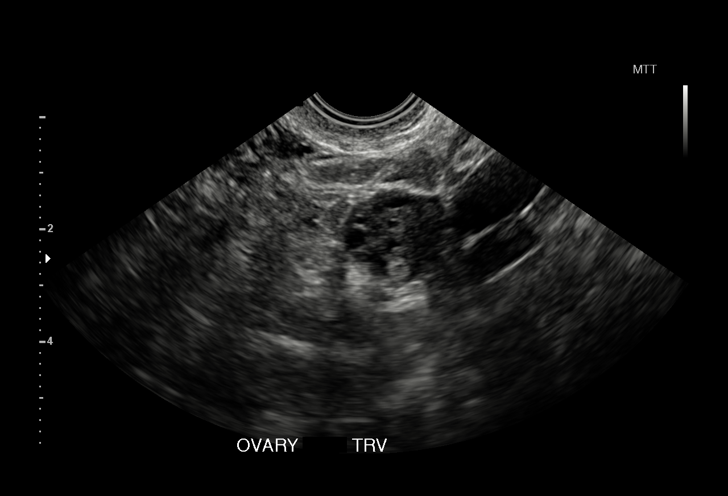
[im 57/81]
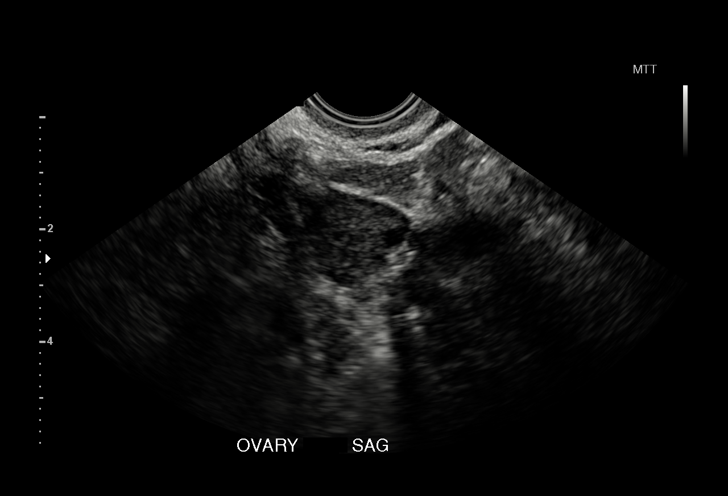
[im 63/81]
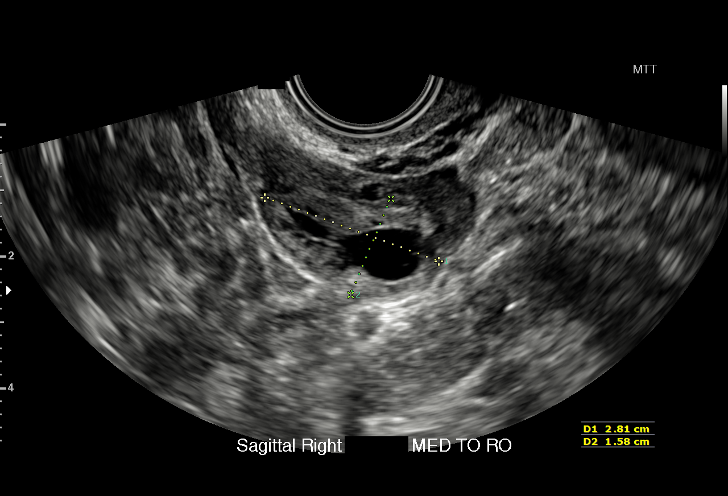
[im 69/81]
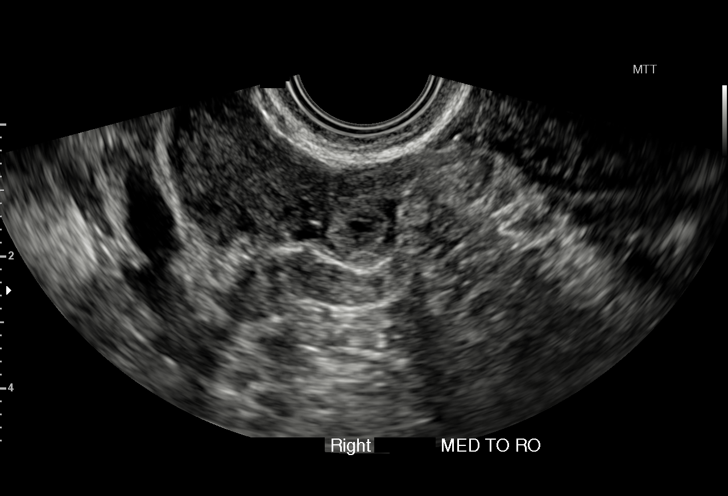
[im 75/81]
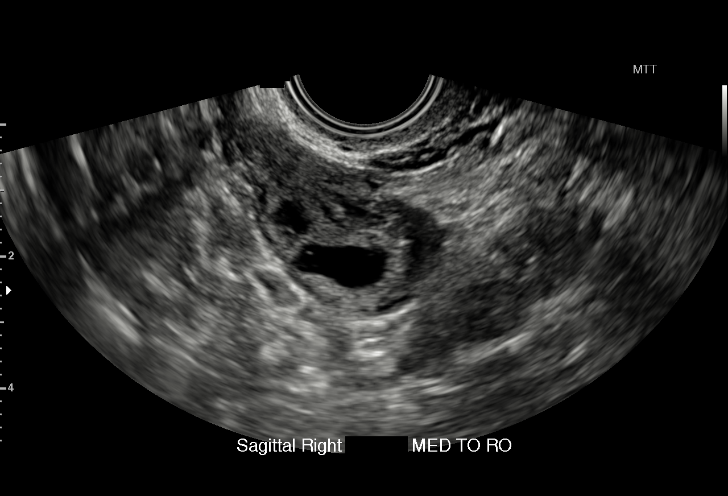
[im 81/81]
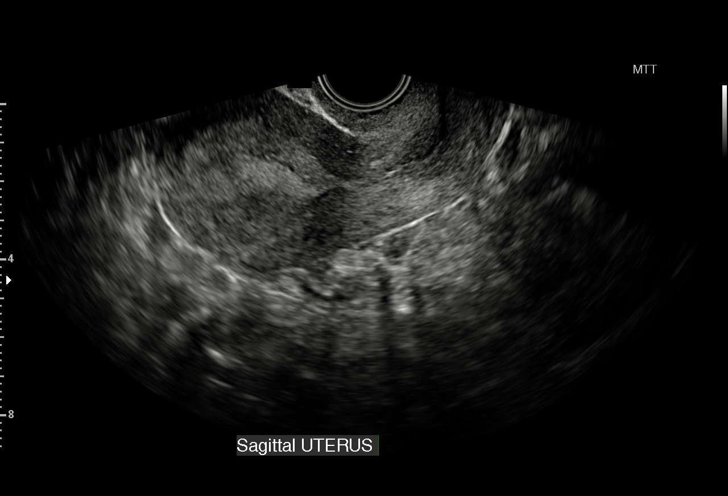

[15 of 28 positions shown; findings below may reference images not displayed]

FINDINGS: The uterus is anteverted and mildly heterogeneous in echotexture.
There is a small focal fluid collection in the fundal uterus or
fundal endometrium. No yolk sac or fetal pole identified.

The right ovary measures 3.6 x 2.2 x 2.8 cm. A hypoechoic structure
with peripheral blood flow noted in the right ovary was compatible
with a corpus luteum. There is a 2.8 x 1.6 x 1.4 cm structure with
hypoechoic solid rim and cystic center medial to the right ovary.
This structure appears to be separate from the right ovary is
concerning for an ectopic pregnancy. Correlation with clinical exam
and obstetrical consult is advised.

The left ovary measures 2.4 x 1.7 x 2.6 cm appears grossly
unremarkable.

No significant free fluid noted within the pelvis.
IMPRESSION: No definite intrauterine pregnancy identified.

Complex structure medial to the right ovary concerning for an
ectopic pregnancy. Correlation with clinical exam and obstetrical
consult is recommended.

Critical Value/emergent results were called by telephone at the time
of interpretation on 09/14/2015 at [DATE] to Nurse [REDACTED] MIRELLA
JIM , who verbally acknowledged these results.

## 2016-03-10 ENCOUNTER — Emergency Department (INDEPENDENT_AMBULATORY_CARE_PROVIDER_SITE_OTHER)
Admission: EM | Admit: 2016-03-10 | Discharge: 2016-03-10 | Disposition: A | Discharge status: Home / Self Care | Payer: 59 | Source: Home / Self Care | Attending: Family Medicine | Admitting: Family Medicine

## 2016-03-10 ENCOUNTER — Encounter (HOSPITAL_COMMUNITY): Payer: Self-pay | Admitting: *Deleted

## 2016-03-10 ENCOUNTER — Emergency Department (INDEPENDENT_AMBULATORY_CARE_PROVIDER_SITE_OTHER): Payer: 59

## 2016-03-10 DIAGNOSIS — K5901 Slow transit constipation: Secondary | ICD-10-CM

## 2016-03-10 DIAGNOSIS — R197 Diarrhea, unspecified: Secondary | ICD-10-CM

## 2016-03-10 DIAGNOSIS — K529 Noninfective gastroenteritis and colitis, unspecified: Secondary | ICD-10-CM | POA: Diagnosis not present

## 2016-03-10 HISTORY — DX: Unspecified ectopic pregnancy without intrauterine pregnancy: O00.90

## 2016-03-10 LAB — POCT I-STAT, CHEM 8
BUN: 11 mg/dL (ref 6–20)
Calcium, Ion: 1.09 mmol/L — ABNORMAL LOW (ref 1.12–1.23)
Chloride: 106 mmol/L (ref 101–111)
Creatinine, Ser: 0.9 mg/dL (ref 0.44–1.00)
Glucose, Bld: 99 mg/dL (ref 65–99)
HEMATOCRIT: 47 % — AB (ref 36.0–46.0)
HEMOGLOBIN: 16 g/dL — AB (ref 12.0–15.0)
POTASSIUM: 3.5 mmol/L (ref 3.5–5.1)
SODIUM: 142 mmol/L (ref 135–145)
TCO2: 26 mmol/L (ref 0–100)

## 2016-03-10 NOTE — Discharge Instructions (Signed)
Constipation, Adult Your watery diarrhea stool is likely due to constipation. The only thing they can get buy some of the thick stool is the watery diarrhea. Recommend using MiraLAX as directed. 17 g per 6-8 ounces of water. Take 2 glasses about 1-2 hours apart for the first evening. If you do not have a good cleansing bowel movement in 24 hours repeat the dosing. Increase the fiber and fluids in your diet. More fruits and vegetables. Constipation is when a person has fewer than three bowel movements a week, has difficulty having a bowel movement, or has stools that are dry, hard, or larger than normal. As people grow older, constipation is more common. A low-fiber diet, not taking in enough fluids, and taking certain medicines may make constipation worse.  CAUSES   Certain medicines, such as antidepressants, pain medicine, iron supplements, antacids, and water pills.   Certain diseases, such as diabetes, irritable bowel syndrome (IBS), thyroid disease, or depression.   Not drinking enough water.   Not eating enough fiber-rich foods.   Stress or travel.   Lack of physical activity or exercise.   Ignoring the urge to have a bowel movement.   Using laxatives too much.  SIGNS AND SYMPTOMS   Having fewer than three bowel movements a week.   Straining to have a bowel movement.   Having stools that are hard, dry, or larger than normal.   Feeling full or bloated.   Pain in the lower abdomen.   Not feeling relief after having a bowel movement.  DIAGNOSIS  Your health care provider will take a medical history and perform a physical exam. Further testing may be done for severe constipation. Some tests may include:  A barium enema X-ray to examine your rectum, colon, and, sometimes, your small intestine.   A sigmoidoscopy to examine your lower colon.   A colonoscopy to examine your entire colon. TREATMENT  Treatment will depend on the severity of your constipation and  what is causing it. Some dietary treatments include drinking more fluids and eating more fiber-rich foods. Lifestyle treatments may include regular exercise. If these diet and lifestyle recommendations do not help, your health care provider may recommend taking over-the-counter laxative medicines to help you have bowel movements. Prescription medicines may be prescribed if over-the-counter medicines do not work.  HOME CARE INSTRUCTIONS   Eat foods that have a lot of fiber, such as fruits, vegetables, whole grains, and beans.  Limit foods high in fat and processed sugars, such as french fries, hamburgers, cookies, candies, and soda.   A fiber supplement may be added to your diet if you cannot get enough fiber from foods.   Drink enough fluids to keep your urine clear or pale yellow.   Exercise regularly or as directed by your health care provider.   Go to the restroom when you have the urge to go. Do not hold it.   Only take over-the-counter or prescription medicines as directed by your health care provider. Do not take other medicines for constipation without talking to your health care provider first.  SEEK IMMEDIATE MEDICAL CARE IF:   You have bright red blood in your stool.   Your constipation lasts for more than 4 days or gets worse.   You have abdominal or rectal pain.   You have thin, pencil-like stools.   You have unexplained weight loss. MAKE SURE YOU:   Understand these instructions.  Will watch your condition.  Will get help right away if you are  not doing well or get worse.   This information is not intended to replace advice given to you by your health care provider. Make sure you discuss any questions you have with your health care provider.   Document Released: 09/07/2004 Document Revised: 12/31/2014 Document Reviewed: 09/21/2013 Elsevier Interactive Patient Education 2016 Elsevier Inc.  High-Fiber Diet Fiber, also called dietary fiber, is a type of  carbohydrate found in fruits, vegetables, whole grains, and beans. A high-fiber diet can have many health benefits. Your health care provider may recommend a high-fiber diet to help:  Prevent constipation. Fiber can make your bowel movements more regular.  Lower your cholesterol.  Relieve hemorrhoids, uncomplicated diverticulosis, or irritable bowel syndrome.  Prevent overeating as part of a weight-loss plan.  Prevent heart disease, type 2 diabetes, and certain cancers. WHAT IS MY PLAN? The recommended daily intake of fiber includes:  38 grams for men under age 34.  30 grams for men over age 34.  25 grams for women under age 34.  21 grams for women over age 34. You can get the recommended daily intake of dietary fiber by eating a variety of fruits, vegetables, grains, and beans. Your health care provider may also recommend a fiber supplement if it is not possible to get enough fiber through your diet. WHAT DO I NEED TO KNOW ABOUT A HIGH-FIBER DIET?  Fiber supplements have not been widely studied for their effectiveness, so it is better to get fiber through food sources.  Always check the fiber content on thenutrition facts label of any prepackaged food. Look for foods that contain at least 5 grams of fiber per serving.  Ask your dietitian if you have questions about specific foods that are related to your condition, especially if those foods are not listed in the following section.  Increase your daily fiber consumption gradually. Increasing your intake of dietary fiber too quickly may cause bloating, cramping, or gas.  Drink plenty of water. Water helps you to digest fiber. WHAT FOODS CAN I EAT? Grains Whole-grain breads. Multigrain cereal. Oats and oatmeal. Brown rice. Barley. Bulgur wheat. Millet. Bran muffins. Popcorn. Rye wafer crackers. Vegetables Sweet potatoes. Spinach. Kale. Artichokes. Cabbage. Broccoli. Green peas. Carrots. Squash. Fruits Berries. Pears. Apples.  Oranges. Avocados. Prunes and raisins. Dried figs. Meats and Other Protein Sources Navy, kidney, pinto, and soy beans. Split peas. Lentils. Nuts and seeds. Dairy Fiber-fortified yogurt. Beverages Fiber-fortified soy milk. Fiber-fortified orange juice. Other Fiber bars. The items listed above may not be a complete list of recommended foods or beverages. Contact your dietitian for more options. WHAT FOODS ARE NOT RECOMMENDED? Grains White bread. Pasta made with refined flour. White rice. Vegetables Fried potatoes. Canned vegetables. Well-cooked vegetables.  Fruits Fruit juice. Cooked, strained fruit. Meats and Other Protein Sources Fatty cuts of meat. Fried Environmental education officerpoultry or fried fish. Dairy Milk. Yogurt. Cream cheese. Sour cream. Beverages Soft drinks. Other Cakes and pastries. Butter and oils. The items listed above may not be a complete list of foods and beverages to avoid. Contact your dietitian for more information. WHAT ARE SOME TIPS FOR INCLUDING HIGH-FIBER FOODS IN MY DIET?  Eat a wide variety of high-fiber foods.  Make sure that half of all grains consumed each day are whole grains.  Replace breads and cereals made from refined flour or white flour with whole-grain breads and cereals.  Replace white rice with brown rice, bulgur wheat, or millet.  Start the day with a breakfast that is high in fiber, such as a  cereal that contains at least 5 grams of fiber per serving.  Use beans in place of meat in soups, salads, or pasta.  Eat high-fiber snacks, such as berries, raw vegetables, nuts, or popcorn.   This information is not intended to replace advice given to you by your health care provider. Make sure you discuss any questions you have with your health care provider.   Document Released: 12/10/2005 Document Revised: 12/31/2014 Document Reviewed: 05/25/2014 Elsevier Interactive Patient Education [employer]

## 2016-03-10 NOTE — ED Notes (Signed)
C/O watery, loose diarrhea x 3-4 wks; occurs every time she puts anything in her mouth ("even gum").  Was initially orange, but then green, now dark green.  Took Z-pak for sinusitis 2 wks ago, but had already had the diarrhea before then.  Also recently started herbs to assist with infertility issues, but started those after diarrhea began.  Denies fevers.  C/O intermittent abd cramping.

## 2016-03-10 NOTE — ED Provider Notes (Signed)
CSN: [id number]     Arrival date & time 03/10/16  1838 History   First MD Initiated Contact with Patient 03/10/16 1944     Chief Complaint  Patient presents with  . Diarrhea   (Consider location/radiation/quality/duration/timing/severity/associated sxs/prior Treatment) HPI Comments: 35 year old female complaining of diarrhea for 4 weeks. She has been having diarrhea every day. Approximately 2 weeks after the initial onset she had developed what she believed to be flulike symptoms with body aches, headaches, fatigue and malaise and visit her PCP. She was told that she did not have the flu but was treated with a Z-Pak for sinus infection. The diarrhea has persisted but has not worsened after the azithromycin. She describes the diarrhea is loose and watery and has changed color from [facility] to various shades of green. She has approximately 4-5 episodes of diarrhea per day. It occurs nearly every time she puts anything in her mouth. She often has abdominal cramping across the lower abdomen. She occasionally has nausea but no vomiting. This first time she saw medical evaluation for her diarrhea. She has no other complaints. She states that she feels well otherwise. No fever, chest pain, shortness of breath or upper respiratory symptoms.   Past Medical History  Diagnosis Date  . Medical history non-contributory   . Headache   . Anxiety   . Ectopic pregnancy     x2   Past Surgical History  Procedure Laterality Date  . Laparoscopy N/A 01/07/2015    Procedure: LAPAROSCOPY DIAGNOSTIC WITH REMOVAL OF ECTOPIC PREGNANCY;  Surgeon: Jaymes Graff, MD;  Location: [facility];  Service: Gynecology;  Laterality: N/A;  . Unilateral salpingectomy Left 01/07/2015    Procedure: LEFT SALPINGECTOMY;  Surgeon: Jaymes Graff, MD;  Location: [facility];  Service: Gynecology;  Laterality: Left;  . Therapeutic abortion    . Laparoscopy Right 09/14/2015    Procedure: LAPAROSCOPY TREATMENT OF ECTOPIC PREGNANCY WITH SALPINGOSTOMY;   Surgeon: Silverio Lay, MD;  Location: [facility];  Service: Gynecology;  Laterality: Right;  with removal of ectopic pregnancy   . Dilation and evacuation      ectopic pregnancy   Family History  Problem Relation Age of Onset  . Diabetes Father    Social History  Substance Use Topics  . Smoking status: Never Smoker   . Smokeless tobacco: Never Used  . Alcohol Use: No     Comment: occ wine   OB History    Gravida Para Term Preterm AB TAB SAB Ectopic Multiple Living   0 2  0     Review of Systems  Constitutional: Positive for activity change and appetite change. Negative for fever and fatigue.  HENT: Negative.   Respiratory: Negative.   Cardiovascular: Negative for chest pain and leg swelling.  Gastrointestinal: Positive for nausea, abdominal pain and diarrhea. Negative for vomiting, constipation, blood in stool and abdominal distention.  Genitourinary: Negative.  Negative for dysuria, decreased urine volume, vaginal discharge, menstrual problem and pelvic pain.  Musculoskeletal: Negative.   Skin: Negative.   Neurological: Negative.   Psychiatric/Behavioral: Negative.     Allergies  Penicillins  Home Medications   Prior to Admission medications   Medication Sig Start Date End Date Taking? Authorizing Provider  NON FORMULARY Herbs: serrapaptase & Vernard Gambles to assist with fertility   Yes Historical Provider, MD  Omega-3 Fatty Acids (FISH OIL PO) Take by mouth.   Yes Historical Provider, MD  Omega-3 Fatty Acids (OMEGA 3 PO) Take by mouth.  Yes Historical Provider, MD  Prenatal Vit-Fe Fumarate-FA (PRENATAL MULTIVITAMIN) TABS tablet Take 1 tablet by mouth daily at 12 noon.   Yes Historical Provider, MD  amitriptyline (ELAVIL) 25 MG tablet Take 25 mg by mouth at bedtime.    Historical Provider, MD  ibuprofen (ADVIL,MOTRIN) 600 MG tablet Take 1 tablet (600 mg total) by mouth every 6 (six) hours as needed. 09/14/15   Silverio Lay, MD  oxyCODONE-acetaminophen (ROXICET) 5-325  MG per tablet Take 1 tablet by mouth every 4 (four) hours as needed for severe pain. 09/14/15   Silverio Lay, MD  zolpidem (AMBIEN) 10 MG tablet Take 10 mg by mouth at bedtime as needed for sleep.     Historical Provider, MD   Meds Ordered and Administered this Visit  Medications - No data to display  BP 122/76 mmHg  Pulse 81  Temp(Src) 98.3 F (36.8 C) (Oral)  Resp 14  SpO2 100%  LMP 03/07/2016  Breastfeeding? No No data found.   Physical Exam  Constitutional: She is oriented to person, place, and time. She appears well-developed and well-nourished. No distress.  HENT:  Mouth/Throat: Oropharynx is clear and moist.  Eyes: Conjunctivae and EOM are normal.  Neck: Normal range of motion. Neck supple.  Cardiovascular: Normal rate, regular rhythm and normal heart sounds.   Pulmonary/Chest: Effort normal and breath sounds normal. No respiratory distress. She has no wheezes.  Abdominal: Soft. Bowel sounds are normal. She exhibits no mass. There is no rebound and no guarding.  Entire abdomen percusses dull to flat. No tympany. Generally soft, no tenderness.  Musculoskeletal: She exhibits no edema.  Lymphadenopathy:    She has no cervical adenopathy.  Neurological: She is alert and oriented to person, place, and time. No cranial nerve deficit. She exhibits normal muscle tone.  Skin: Skin is warm and dry.  Psychiatric: She has a normal mood and affect.  Nursing note and vitals reviewed.   ED Course  Procedures (including critical care time)  Labs Review Labs Reviewed  POCT I-STAT, CHEM 8 - Abnormal; Notable for the following:    Calcium, Ion 1.09 (*)    Hemoglobin 16.0 (*)    HCT 47.0 (*)    All other components within normal limits  GASTROINTESTINAL PANEL BY PCR, STOOL (REPLACES STOOL CULTURE)    Imaging Review Dg Abd 1 View  03/10/2016  CLINICAL DATA:  Abdominal pain. Nausea. Diarrhea for the past 4 weeks. EXAM: ABDOMEN - 1 VIEW COMPARISON:  Abdomen pelvis CT dated  12/04/2011. FINDINGS: Normal bowel gas pattern. Inferior pelvic phleboliths. Unremarkable bones. IMPRESSION: No acute abnormality. Electronically Signed   By: Beckie Salts M.D.   On: 03/10/2016 20:23     Visual Acuity Review  Right Eye Distance:   Left Eye Distance:   Bilateral Distance:    Right Eye Near:   Left Eye Near:    Bilateral Near:         MDM   1. Slow transit constipation   2. Diarrhea in adult patient     Your watery diarrhea stool is likely due to constipation. The only thing they can get buy some of the thick stool is the watery diarrhea. Recommend using MiraLAX as directed. 17 g per 6-8 ounces of water. Take 2 glasses about 1-2 hours apart for the first evening. If you do not have a good cleansing bowel movement in 24 hours repeat the dosing. Increase the fiber and fluids in your diet. More fruits and vegetables.  Verbal and written discharge  instructions have been reviewed and given to the patient  by the provider to include medications and other care measures.  PCR stool pending  Hayden RasmussenDavid Ulrich Soules, NP 03/10/16 2112  Hayden Rasmussenavid Analeigha Nauman, NP 03/10/16 2114

## 2016-03-11 LAB — GASTROINTESTINAL PANEL BY PCR, STOOL (REPLACES STOOL CULTURE)
ADENOVIRUS F40/41: NOT DETECTED
ASTROVIRUS: NOT DETECTED
CAMPYLOBACTER SPECIES: NOT DETECTED
CRYPTOSPORIDIUM: NOT DETECTED
Cyclospora cayetanensis: NOT DETECTED
E. coli O157: NOT DETECTED
ENTEROAGGREGATIVE E COLI (EAEC): NOT DETECTED
ENTEROPATHOGENIC E COLI (EPEC): NOT DETECTED
ENTEROTOXIGENIC E COLI (ETEC): NOT DETECTED
Entamoeba histolytica: NOT DETECTED
Giardia lamblia: NOT DETECTED
NOROVIRUS GI/GII: NOT DETECTED
PLESIMONAS SHIGELLOIDES: NOT DETECTED
ROTAVIRUS A: NOT DETECTED
SHIGA LIKE TOXIN PRODUCING E COLI (STEC): NOT DETECTED
Salmonella species: NOT DETECTED
Sapovirus (I, II, IV, and V): NOT DETECTED
Shigella/Enteroinvasive E coli (EIEC): NOT DETECTED
Vibrio cholerae: NOT DETECTED
Vibrio species: NOT DETECTED
Yersinia enterocolitica: NOT DETECTED

## 2016-03-12 ENCOUNTER — Inpatient Hospital Stay (HOSPITAL_COMMUNITY)
Admission: AD | Admit: 2016-03-12 | Discharge: 2016-03-13 | Disposition: A | Discharge status: Home / Self Care | Payer: 59 | Source: Ambulatory Visit | Attending: Obstetrics and Gynecology | Admitting: Obstetrics and Gynecology

## 2016-03-12 DIAGNOSIS — Z880 Allergy status to penicillin: Secondary | ICD-10-CM | POA: Diagnosis not present

## 2016-03-12 DIAGNOSIS — R42 Dizziness and giddiness: Secondary | ICD-10-CM | POA: Diagnosis not present

## 2016-03-12 DIAGNOSIS — Z833 Family history of diabetes mellitus: Secondary | ICD-10-CM | POA: Insufficient documentation

## 2016-03-12 DIAGNOSIS — R110 Nausea: Secondary | ICD-10-CM | POA: Insufficient documentation

## 2016-03-12 NOTE — MAU Note (Signed)
Pt states she has had diarrhea for the last 3 weeks and was seen at Urgent Care on Saturday and was told to take Miralax and has not had a BM since Saturday. Saw gastroenterologist today and he told her to stop the miralax. Tonight she began feeling nauseated and felt like she was going to "black out".

## 2016-03-13 ENCOUNTER — Encounter (HOSPITAL_COMMUNITY): Payer: Self-pay

## 2016-03-13 ENCOUNTER — Other Ambulatory Visit: Payer: Self-pay | Admitting: Gastroenterology

## 2016-03-13 DIAGNOSIS — R1084 Generalized abdominal pain: Secondary | ICD-10-CM

## 2016-03-13 DIAGNOSIS — R197 Diarrhea, unspecified: Secondary | ICD-10-CM

## 2016-03-13 LAB — URINALYSIS, ROUTINE W REFLEX MICROSCOPIC
Bilirubin Urine: NEGATIVE
Glucose, UA: NEGATIVE mg/dL
HGB URINE DIPSTICK: NEGATIVE
Ketones, ur: NEGATIVE mg/dL
Leukocytes, UA: NEGATIVE
NITRITE: NEGATIVE
Protein, ur: NEGATIVE mg/dL
pH: 5.5 (ref 5.0–8.0)

## 2016-03-13 LAB — CBC
HEMATOCRIT: 37.8 % (ref 36.0–46.0)
Hemoglobin: 13.7 g/dL (ref 12.0–15.0)
MCH: 32 pg (ref 26.0–34.0)
MCHC: 36.2 g/dL — ABNORMAL HIGH (ref 30.0–36.0)
MCV: 88.3 fL (ref 78.0–100.0)
Platelets: 225 10*3/uL (ref 150–400)
RBC: 4.28 MIL/uL (ref 3.87–5.11)
RDW: 12.9 % (ref 11.5–15.5)
WBC: 6 10*3/uL (ref 4.0–10.5)

## 2016-03-13 LAB — COMPREHENSIVE METABOLIC PANEL
ALT: 14 U/L (ref 14–54)
AST: 21 U/L (ref 15–41)
Albumin: 3.9 g/dL (ref 3.5–5.0)
Alkaline Phosphatase: 53 U/L (ref 38–126)
Anion gap: 6 (ref 5–15)
BILIRUBIN TOTAL: 1 mg/dL (ref 0.3–1.2)
BUN: 13 mg/dL (ref 6–20)
CO2: 25 mmol/L (ref 22–32)
CREATININE: 0.71 mg/dL (ref 0.44–1.00)
Calcium: 8.5 mg/dL — ABNORMAL LOW (ref 8.9–10.3)
Chloride: 108 mmol/L (ref 101–111)
GFR calc Af Amer: >60 mL/min (ref >60)
Glucose, Bld: 114 mg/dL — ABNORMAL HIGH (ref 65–99)
Potassium: 3 mmol/L — ABNORMAL LOW (ref 3.5–5.1)
Sodium: 139 mmol/L (ref 135–145)
TOTAL PROTEIN: 6.9 g/dL (ref 6.5–8.1)

## 2016-03-13 MED ORDER — ONDANSETRON HCL 4 MG PO TABS: 4.0000 mg | ORAL_TABLET | Freq: Three times a day (TID) | ORAL | Status: AC | PRN

## 2016-03-13 MED ORDER — ONDANSETRON HCL 4 MG PO TABS
8.0000 mg | ORAL_TABLET | Freq: Once | ORAL | Status: AC
  Administered 2016-03-13: 8 mg via ORAL
  Filled 2016-03-13: qty 2

## 2016-03-13 NOTE — MAU Provider Note (Signed)
History    Latoya Ibarra, Latoya Ibarra is a 35 yo, G4P0030 with an LMP of 03/07/16 presenting to MAU unannounced for nausea and dizzness, this evening, like she was "gong to black out". States she has not eat or drank anything today.   Denies issue with urination or vaginal discharge.  Report she saw a gastroenterologist today for recent chronic issues with her stool.  She reports diarrhea for the last 3 weeks and was seen at urgent care on Saturday, 3/18 and diagnosed with slow transit constipation and diarrhea.  Per pt, she was told to take Miralax and has not had a BM since Saturday.  When she saw the gastroenterologist, she was told to stop taking Miralax.       DG abd 1 view from 3/18 states:  Normal bowel gas pattern, inferior pelvic phleboliths. Unremarkable bones. No acute abnormality.   Patient Active Problem List   Diagnosis Date Noted  . Hx of ectopic pregnancy--"years ago", unknown laterality, no treatment 01/07/2015  . Allergy to penicillin 01/07/2015  . Migraines 01/07/2015  . Ectopic pregnancy 01/08/15--removal left tube 01/07/2015    No chief complaint on file.  HPI  OB History    Gravida Para Term Preterm AB TAB SAB Ectopic Multiple Living   4    3 1  0 2  0      Past Medical History  Diagnosis Date  . Medical history non-contributory   . Headache   . Anxiety   . Ectopic pregnancy     x2    Past Surgical History  Procedure Laterality Date  . Laparoscopy N/A 01/07/2015    Procedure: LAPAROSCOPY DIAGNOSTIC WITH REMOVAL OF ECTOPIC PREGNANCY;  Surgeon: Crawford Givens, MD;  Location: [facility];  Service: Gynecology;  Laterality: N/A;  . Unilateral salpingectomy Left 01/07/2015    Procedure: LEFT SALPINGECTOMY;  Surgeon: Crawford Givens, MD;  Location: [facility];  Service: Gynecology;  Laterality: Left;  . Therapeutic abortion    . Laparoscopy Right 09/14/2015    Procedure: LAPAROSCOPY TREATMENT OF ECTOPIC PREGNANCY WITH SALPINGOSTOMY;  Surgeon: Delsa Bern, MD;  Location: [facility];   Service: Gynecology;  Laterality: Right;  with removal of ectopic pregnancy   . Dilation and evacuation      ectopic pregnancy    Family History  Problem Relation Age of Onset  . Diabetes Father     Social History  Substance Use Topics  . Smoking status: Never Smoker   . Smokeless tobacco: Never Used  . Alcohol Use: No     Comment: occ wine    Allergies:  Allergies  Allergen Reactions  . Penicillins Hives    Has patient had a PCN reaction causing immediate rash, facial/tongue/throat swelling, SOB or lightheadedness with hypotension: No Has patient had a PCN reaction causing severe rash involving mucus membranes or skin necrosis: No Has patient had a PCN reaction that required hospitalization No Has patient had a PCN reaction occurring within the last 10 years: No If all of the above answers are "NO", then may proceed with Cephalosporin use.      Prescriptions prior to admission  Medication Sig Dispense Refill Last Dose  . polyethylene glycol (MIRALAX / GLYCOLAX) packet Take 17 g by mouth daily.   03/11/2016 at Unknown time  . zolpidem (AMBIEN) 10 MG tablet Take 10 mg by mouth at bedtime as needed for sleep.    03/11/2016 at Unknown time  . amitriptyline (ELAVIL) 25 MG tablet Take 25 mg by mouth at bedtime.   09/11/2015  .  ibuprofen (ADVIL,MOTRIN) 600 MG tablet Take 1 tablet (600 mg total) by mouth every 6 (six) hours as needed. 30 tablet 0   . NON FORMULARY Herbs: serrapaptase & Edwena Felty to assist with fertility     . Omega-3 Fatty Acids (FISH OIL PO) Take by mouth.     . Omega-3 Fatty Acids (OMEGA 3 PO) Take by mouth.     . oxyCODONE-acetaminophen (ROXICET) 5-325 MG per tablet Take 1 tablet by mouth every 4 (four) hours as needed for severe pain. 30 tablet 0   . Prenatal Vit-Fe Fumarate-FA (PRENATAL MULTIVITAMIN) TABS tablet Take 1 tablet by mouth daily at 12 noon.   09/11/2015    ROS Physical Exam   Blood pressure 136/81, pulse 90, temperature 98.8 F (37.1 C),  temperature source Oral, resp. rate 16, height 5' 5"  (1.651 m), weight 93.895 kg (207 lb), last menstrual period 03/07/2016, SpO2 100 %.  Results for orders placed or performed during the hospital encounter of 03/12/16 (from the past 48 hour(s))  Urinalysis, Routine w reflex microscopic (not at [facility])     Status: Abnormal   Collection Time: 03/12/16 11:33 PM  Result Value Ref Range   Color, Urine YELLOW YELLOW   APPearance CLEAR CLEAR   Specific Gravity, Urine >1.030 (H) 1.005 - 1.030   pH 5.5 5.0 - 8.0   Glucose, UA NEGATIVE NEGATIVE mg/dL   Hgb urine dipstick NEGATIVE NEGATIVE   Bilirubin Urine NEGATIVE NEGATIVE   Ketones, ur NEGATIVE NEGATIVE mg/dL   Protein, ur NEGATIVE NEGATIVE mg/dL   Nitrite NEGATIVE NEGATIVE   Leukocytes, UA NEGATIVE NEGATIVE    Comment: MICROSCOPIC NOT DONE ON URINES WITH NEGATIVE PROTEIN, BLOOD, LEUKOCYTES, NITRITE, OR GLUCOSE <1000 mg/dL.  CBC     Status: Abnormal   Collection Time: 03/13/16  1:58 AM  Result Value Ref Range   WBC 6.0 4.0 - 10.5 K/uL   RBC 4.28 3.87 - 5.11 MIL/uL   Hemoglobin 13.7 12.0 - 15.0 g/dL   HCT 37.8 36.0 - 46.0 %   MCV 88.3 78.0 - 100.0 fL   MCH 32.0 26.0 - 34.0 pg   MCHC 36.2 (H) 30.0 - 36.0 g/dL   RDW 12.9 11.5 - 15.5 %   Platelets 225 150 - 400 K/uL  Comprehensive metabolic panel     Status: Abnormal   Collection Time: 03/13/16  1:58 AM  Result Value Ref Range   Sodium 139 135 - 145 mmol/L   Potassium 3.0 (L) 3.5 - 5.1 mmol/L   Chloride 108 101 - 111 mmol/L   CO2 25 22 - 32 mmol/L   Glucose, Bld 114 (H) 65 - 99 mg/dL   BUN 13 6 - 20 mg/dL   Creatinine, Ser 0.71 0.44 - 1.00 mg/dL   Calcium 8.5 (L) 8.9 - 10.3 mg/dL   Total Protein 6.9 6.5 - 8.1 g/dL   Albumin 3.9 3.5 - 5.0 g/dL   AST 21 15 - 41 U/L   ALT 14 14 - 54 U/L   Alkaline Phosphatase 53 38 - 126 U/L   Total Bilirubin 1.0 0.3 - 1.2 mg/dL   GFR calc non Af Amer >60 >60 mL/min   GFR calc Af Amer >60 >60 mL/min    Comment: (NOTE) The eGFR has been calculated  using the CKD EPI equation. This calculation has not been validated in all clinical situations. eGFR's persistently <60 mL/min signify possible Chronic Kidney Disease.    Anion gap 6 5 - 15    Physical Exam  Constitutional:  She is oriented to person, place, and time. She appears well-developed and well-nourished.  HENT:  Head: Normocephalic and atraumatic.  Eyes: Pupils are equal, round, and reactive to light.  Neck: Normal range of motion.  Cardiovascular: Normal rate and regular rhythm.   Respiratory: Effort normal.  GI: Soft. Bowel sounds are normal. She exhibits no distension and no mass. There is no tenderness. There is no rebound and no guarding.  Musculoskeletal: Normal range of motion.  Neurological: She is alert and oriented to person, place, and time. She has normal reflexes.  Skin: Skin is warm and dry.  Psychiatric: She has a normal mood and affect. Her behavior is normal. Judgment and thought content normal.    ED Course  Assessment: Nausea without vomiting   Plan: DC home  Zofran 8 mg  Rx Zofran 4 mg PO Discussed eating BRAT diet Multivitamin recommended  Encouraged to follow up with Gastroenterologist today during business hours F/u at [facility] for acute  GI/bowel symptoms  [place], MSN 03/13/2016 2:38 AM

## 2016-03-13 NOTE — Discharge Instructions (Signed)
Nausea and Vomiting Nausea means you feel sick to your stomach. Throwing up (vomiting) is a reflex where stomach contents come out of your mouth. HOME CARE   Take medicine as told by your doctor.  Do not force yourself to eat. However, you do need to drink fluids.  If you feel like eating, eat a normal diet as told by your doctor.  Eat rice, wheat, potatoes, bread, lean meats, yogurt, fruits, and vegetables.  Avoid high-fat foods.  Drink enough fluids to keep your pee (urine) clear or pale yellow.  Ask your doctor how to replace body fluid losses (rehydrate). Signs of body fluid loss (dehydration) include:  Feeling very thirsty.  Dry lips and mouth.  Feeling dizzy.  Dark pee.  Peeing less than normal.  Feeling confused.  Fast breathing or heart rate. GET HELP RIGHT AWAY IF:   You have blood in your throw up.  You have black or bloody poop (stool).  You have a bad headache or stiff neck.  You feel confused.  You have bad belly (abdominal) pain.  You have chest pain or trouble breathing.  You do not pee at least once every 8 hours.  You have cold, clammy skin.  You keep throwing up after 24 to 48 hours.  You have a fever. MAKE SURE YOU:   Understand these instructions.  Will watch your condition.  Will get help right away if you are not doing well or get worse.   This information is not intended to replace advice given to you by your health care provider. Make sure you discuss any questions you have with your health care provider.   Document Released: 05/28/2008 Document Revised: 03/03/2012 Document Reviewed: 05/11/2011 Elsevier Interactive Patient Education 2016 [employer]. Constipation, Adult Constipation is when a person:  Poops (has a bowel movement) less than 3 times a week.  Has a hard time pooping.  Has poop that is dry, hard, or bigger than normal. HOME CARE   Eat foods with a lot of fiber in them. This includes fruits, vegetables,  beans, and whole grains such as brown rice.  Avoid fatty foods and foods with a lot of sugar. This includes french fries, hamburgers, cookies, candy, and soda.  If you are not getting enough fiber from food, take products with added fiber in them (supplements).  Drink enough fluid to keep your pee (urine) clear or pale yellow.  Exercise on a regular basis, or as told by your doctor.  Go to the restroom when you feel like you need to poop. Do not hold it.  Only take medicine as told by your doctor. Do not take medicines that help you poop (laxatives) without talking to your doctor first. GET HELP RIGHT AWAY IF:   You have bright red blood in your poop (stool).  Your constipation lasts more than 4 days or gets worse.  You have belly (abdominal) or butt (rectal) pain.  You have thin poop (as thin as a pencil).  You lose weight, and it cannot be explained. MAKE SURE YOU:   Understand these instructions.  Will watch your condition.  Will get help right away if you are not doing well or get worse.   This information is not intended to replace advice given to you by your health care provider. Make sure you discuss any questions you have with your health care provider.   Document Released: 05/28/2008 Document Revised: 12/31/2014 Document Reviewed: 09/21/2013 Elsevier Interactive Patient Education 2016 [employer] Chronic Diarrhea  Diarrhea is frequent loose and watery bowel movements. It can cause you to feel weak and dehydrated. Dehydration can cause you to become tired and thirsty and to have a dry mouth, decreased urination, and dark yellow urine. Diarrhea is a sign of another problem, most often an infection that will not last long. In most cases, diarrhea lasts 2-3 days. Diarrhea that lasts longer than 4 weeks is called long-lasting (chronic) diarrhea. It is important to treat your diarrhea as directed by your health care provider to lessen or prevent future episodes of diarrhea.    CAUSES  There are many causes of chronic diarrhea. The following are some possible causes:   Gastrointestinal infections caused by viruses, bacteria, or parasites.   Food poisoning or food allergies.   Certain medicines, such as antibiotics, chemotherapy, and laxatives.   Artificial sweeteners and fructose.   Digestive disorders, such as celiac disease and inflammatory bowel diseases.   Irritable bowel syndrome.  Some disorders of the pancreas.  Disorders of the thyroid.  Reduced blood flow to the intestines.  Cancer. Sometimes the cause of chronic diarrhea is unknown. RISK FACTORS  Having a severely weakened immune system, such as from HIV or AIDS.   Taking certain types of cancer-fighting drugs (such as with chemotherapy) or other medicines.   Having had a recent organ transplant.   Having a portion of the stomach or small bowel removed.   Traveling to countries where food and water supplies are often contaminated.  SYMPTOMS  In addition to frequent, loose stools, diarrhea may cause:   Cramping.   Abdominal pain.   Nausea.   Fever.  Fatigue.  Urgent need to use the bathroom.  Loss of bowel control. DIAGNOSIS  Your health care provider must take a careful history and perform a physical exam. Tests given are based on your symptoms and history. Tests may include:   Blood or stool tests. Three or more stool samples may be examined. Stool cultures may be used to test for bacteria or parasites.   X-rays.   A procedure in which a thin tube is inserted into the mouth or rectum (endoscopy). This allows the health care provider to look inside the intestine.  TREATMENT   Treatment is aimed at correcting the cause of the diarrhea when possible.  Diarrhea caused by an infection can often be treated with antibiotic medicines.  Diarrhea not caused by an infection may require you to take long-term medicine or have surgery. Specific treatment should  be discussed with your health care provider.  If the cause cannot be determined, treatment aims to relieve symptoms and prevent dehydration. Serious health problems can occur if you do not maintain proper fluid levels. Treatment may include:  Taking an oral rehydration solution (ORS).  Not drinking beverages that contain caffeine (such as tea, coffee, and soft drinks).  Not drinking alcohol.  Maintaining well-balanced nutrition to help you recover faster. HOME CARE INSTRUCTIONS   Drink enough fluids to keep urine clear or pale yellow. Drink 1 cup (8 oz) of fluid for each diarrhea episode. Avoid fluids that contain simple sugars, fruit juices, whole milk products, and sodas. Hydrate with an ORS. You may purchase the ORS or prepare it at home by mixing the following ingredients together:   - tsp (1.7-3  mL) table salt.   tsp (3  mL) baking soda.   tsp (1.7 mL) salt substitute containing potassium chloride.  1 tbsp (20 mL) sugar.  4.2 c (1 L) of water.  Certain foods and beverages may increase the speed at which food moves through the gastrointestinal (GI) tract. These foods and beverages should be avoided. They include:  Caffeinated and alcoholic beverages.  High-fiber foods, such as raw fruits and vegetables, nuts, seeds, and whole grain breads and cereals.  Foods and beverages sweetened with sugar alcohols, such as xylitol, sorbitol, and mannitol.   Some foods may be well tolerated and may help thicken stool. These include:  Starchy foods, such as rice, toast, pasta, low-sugar cereal, oatmeal, grits, baked potatoes, crackers, and bagels.  Bananas.  Applesauce.  Add probiotic-rich foods to help increase healthy bacteria in the GI tract. These include yogurt and fermented milk products.  Wash your hands well after each diarrhea episode.  Only take over-the-counter or prescription medicines as directed by your health care provider.  Take a warm bath to relieve any  burning or pain from frequent diarrhea episodes. SEEK MEDICAL CARE IF:   You are not urinating as often.  Your urine is a dark color.  You become very tired or dizzy.  You have severe pain in the abdomen or rectum.  Your have blood or pus in your stools.  Your stools look black and tarry. SEEK IMMEDIATE MEDICAL CARE IF:   You are unable to keep fluids down.  You have persistent vomiting.  You have blood in your stool.  Your stools are black and tarry.  You do not urinate in 6-8 hours, or there is only a small amount of very dark urine.  You have abdominal pain that increases or localizes.  You have weakness, dizziness, confusion, or lightheadedness.  You have a severe headache.  Your diarrhea gets worse or does not get better.  You have a fever or persistent symptoms for more than 2-3 days.  You have a fever and your symptoms suddenly get worse. MAKE SURE YOU:   Understand these instructions.  Will watch your condition.  Will get help right away if you are not doing well or get worse.   This information is not intended to replace advice given to you by your health care provider. Make sure you discuss any questions you have with your health care provider.   Document Released: 03/01/2004 Document Revised: 12/15/2013 Document Reviewed: 06/04/2013 Elsevier Interactive Patient Education [employer]

## 2016-03-21 ENCOUNTER — Emergency Department (HOSPITAL_COMMUNITY): Payer: 59

## 2016-03-21 ENCOUNTER — Encounter (HOSPITAL_COMMUNITY): Payer: Self-pay | Admitting: Emergency Medicine

## 2016-03-21 ENCOUNTER — Emergency Department (HOSPITAL_COMMUNITY)
Admission: EM | Admit: 2016-03-21 | Discharge: 2016-03-21 | Disposition: A | Discharge status: Home / Self Care | Payer: 59 | Attending: Emergency Medicine | Admitting: Emergency Medicine

## 2016-03-21 DIAGNOSIS — F419 Anxiety disorder, unspecified: Secondary | ICD-10-CM | POA: Insufficient documentation

## 2016-03-21 DIAGNOSIS — R079 Chest pain, unspecified: Secondary | ICD-10-CM | POA: Diagnosis not present

## 2016-03-21 DIAGNOSIS — Z880 Allergy status to penicillin: Secondary | ICD-10-CM | POA: Insufficient documentation

## 2016-03-21 DIAGNOSIS — Z79899 Other long term (current) drug therapy: Secondary | ICD-10-CM | POA: Diagnosis not present

## 2016-03-21 DIAGNOSIS — R197 Diarrhea, unspecified: Secondary | ICD-10-CM | POA: Diagnosis not present

## 2016-03-21 LAB — BASIC METABOLIC PANEL
Anion gap: 10 (ref 5–15)
BUN: 12 mg/dL (ref 6–20)
CHLORIDE: 109 mmol/L (ref 101–111)
CO2: 22 mmol/L (ref 22–32)
CREATININE: 0.73 mg/dL (ref 0.44–1.00)
Calcium: 9 mg/dL (ref 8.9–10.3)
GFR calc non Af Amer: >60 mL/min (ref >60)
GLUCOSE: 96 mg/dL (ref 65–99)
Potassium: 3.7 mmol/L (ref 3.5–5.1)
Sodium: 141 mmol/L (ref 135–145)

## 2016-03-21 LAB — I-STAT TROPONIN, ED: Troponin i, poc: 0 ng/mL (ref 0.00–0.08)

## 2016-03-21 LAB — CBC
HCT: 42.3 % (ref 36.0–46.0)
Hemoglobin: 14.9 g/dL (ref 12.0–15.0)
MCH: 32 pg (ref 26.0–34.0)
MCHC: 35.2 g/dL (ref 30.0–36.0)
MCV: 91 fL (ref 78.0–100.0)
PLATELETS: 212 10*3/uL (ref 150–400)
RBC: 4.65 MIL/uL (ref 3.87–5.11)
RDW: 13.1 % (ref 11.5–15.5)
WBC: 5.1 10*3/uL (ref 4.0–10.5)

## 2016-03-21 MED ORDER — LORAZEPAM 1 MG PO TABS: 1.0000 mg | ORAL_TABLET | Freq: Three times a day (TID) | ORAL | Status: DC | PRN

## 2016-03-21 MED ORDER — IBUPROFEN 600 MG PO TABS: 600.0000 mg | ORAL_TABLET | Freq: Four times a day (QID) | ORAL | Status: DC | PRN

## 2016-03-21 NOTE — ED Provider Notes (Signed)
CSN: [id number]     Arrival date & time 03/21/16  1103 History   First MD Initiated Contact with Patient 03/21/16 1346     Chief Complaint  Patient presents with  . Chest Pain     (Consider location/radiation/quality/duration/timing/severity/associated sxs/prior Treatment) HPI Comments: Patient presents emergency department with chief complaint of chest pain. She states that the chest pain started early this morning. Hit has been improving since then. She denies any associated shortness of breath. States that she did have some radiating pain to her right arm. She states that she is experiences symptoms before. The pain is reproducible with palpation. She denies any injuries or lifting anything heavy. Denies any fevers or chills. She states she has been under a lot of stress. She is concerned about several other medical problems she has had including diarrhea 1 month. She is being seen by gastroenterology for this, and this not an acute problem today, however she feels that the stress going through this could be causing her pain/anxiety.  The history is provided by the patient. No language interpreter was used.    Past Medical History  Diagnosis Date  . Medical history non-contributory   . Headache   . Anxiety   . Ectopic pregnancy     x2   Past Surgical History  Procedure Laterality Date  . Laparoscopy N/A 01/07/2015    Procedure: LAPAROSCOPY DIAGNOSTIC WITH REMOVAL OF ECTOPIC PREGNANCY;  Surgeon: Jaymes Graff, MD;  Location: [facility];  Service: Gynecology;  Laterality: N/A;  . Unilateral salpingectomy Left 01/07/2015    Procedure: LEFT SALPINGECTOMY;  Surgeon: Jaymes Graff, MD;  Location: [facility];  Service: Gynecology;  Laterality: Left;  . Therapeutic abortion    . Laparoscopy Right 09/14/2015    Procedure: LAPAROSCOPY TREATMENT OF ECTOPIC PREGNANCY WITH SALPINGOSTOMY;  Surgeon: Silverio Lay, MD;  Location: [facility];  Service: Gynecology;  Laterality: Right;  with removal of ectopic  pregnancy   . Dilation and evacuation      ectopic pregnancy   Family History  Problem Relation Age of Onset  . Diabetes Father    Social History  Substance Use Topics  . Smoking status: Never Smoker   . Smokeless tobacco: Never Used  . Alcohol Use: No     Comment: occ wine   OB History    Gravida Para Term Preterm AB TAB SAB Ectopic Multiple Living   0 2  0     Review of Systems  Constitutional: Negative for fever and chills.  Respiratory: Negative for shortness of breath.   Cardiovascular: Positive for chest pain.  Gastrointestinal: Negative for nausea, vomiting, diarrhea and constipation.  Genitourinary: Negative for dysuria.  All other systems reviewed and are negative.     Allergies  Penicillins  Home Medications   Prior to Admission medications   Medication Sig Start Date End Date Taking? Authorizing Provider  amitriptyline (ELAVIL) 10 MG tablet Take 10 mg by mouth at bedtime.   Yes Historical Provider, MD  Zolpidem Tartrate (AMBIEN PO) Take 1 tablet by mouth at bedtime.   Yes Historical Provider, MD  ibuprofen (ADVIL,MOTRIN) 600 MG tablet Take 1 tablet (600 mg total) by mouth every 6 (six) hours as needed. 03/21/16   Roxy Horseman, PA-C  LORazepam (ATIVAN) 1 MG tablet Take 1 tablet (1 mg total) by mouth every 8 (eight) hours as needed for anxiety. 03/21/16   Roxy Horseman, PA-C  ondansetron (ZOFRAN) 4 MG tablet Take 1 tablet (4 mg total)  by mouth every 8 (eight) hours as needed for nausea or vomiting. 03/13/16 03/13/17  Alphonzo Severanceachel Stall, CNM  Prenatal Vit-Fe Fumarate-FA (PRENATAL MULTIVITAMIN) TABS tablet Take 1 tablet by mouth daily at 12 noon.    Historical Provider, MD   BP 118/84 mmHg  Pulse 76  Temp(Src) 98.4 F (36.9 C) (Oral)  Resp 16  SpO2 100%  LMP 03/07/2016 Physical Exam  Constitutional: She is oriented to person, place, and time. She appears well-developed and well-nourished.  HENT:  Head: Normocephalic and atraumatic.  Eyes:  Conjunctivae and EOM are normal. Pupils are equal, round, and reactive to light.  Neck: Normal range of motion. Neck supple.  Cardiovascular: Normal rate and regular rhythm.  Exam reveals no gallop and no friction rub.   No murmur heard. Pulmonary/Chest: Effort normal and breath sounds normal. No respiratory distress. She has no wheezes. She has no rales. She exhibits no tenderness.  Abdominal: Soft. Bowel sounds are normal. She exhibits no distension and no mass. There is no tenderness. There is no rebound and no guarding.  Musculoskeletal: Normal range of motion. She exhibits no edema or tenderness.  Neurological: She is alert and oriented to person, place, and time.  Skin: Skin is warm and dry.  Psychiatric: She has a normal mood and affect. Her behavior is normal. Judgment and thought content normal.  Nursing note and vitals reviewed.   ED Course  Procedures (including critical care time) Results for orders placed or performed during the hospital encounter of 03/21/16  Basic metabolic panel  Result Value Ref Range   Sodium 141 135 - 145 mmol/L   Potassium 3.7 3.5 - 5.1 mmol/L   Chloride 109 101 - 111 mmol/L   CO2 22 22 - 32 mmol/L   Glucose, Bld 96 65 - 99 mg/dL   BUN 12 6 - 20 mg/dL   Creatinine, Ser 0.980.73 0.44 - 1.00 mg/dL   Calcium 9.0 8.9 - 11.910.3 mg/dL   GFR calc non Af Amer >60 >60 mL/min   GFR calc Af Amer >60 >60 mL/min   Anion gap 10 5 - 15  CBC  Result Value Ref Range   WBC 5.1 4.0 - 10.5 K/uL   RBC 4.65 3.87 - 5.11 MIL/uL   Hemoglobin 14.9 12.0 - 15.0 g/dL   HCT 14.742.3 82.936.0 - 56.246.0 %   MCV 91.0 78.0 - 100.0 fL   MCH 32.0 26.0 - 34.0 pg   MCHC 35.2 30.0 - 36.0 g/dL   RDW 13.013.1 86.511.5 - 78.415.5 %   Platelets 212 150 - 400 K/uL  I-stat troponin, ED ([facility])  Result Value Ref Range   Troponin i, poc 0.00 0.00 - 0.08 ng/mL   Comment 3           Dg Chest 2 View  03/21/2016  CLINICAL DATA:  Chest pain and right arm numbness beginning this morning. Initial  encounter. EXAM: CHEST  2 VIEW COMPARISON:  PA and lateral chest 12/24/2014 and 01/02/2013. FINDINGS: The lungs are clear. Heart size is normal. There is no pneumothorax or pleural effusion. No focal bony abnormality. IMPRESSION: Negative chest. Electronically Signed   By: Drusilla Kannerhomas  Dalessio M.D.   On: 03/21/2016 11:34   Dg Abd 1 View  03/10/2016  CLINICAL DATA:  Abdominal pain. Nausea. Diarrhea for the past 4 weeks. EXAM: ABDOMEN - 1 VIEW COMPARISON:  Abdomen pelvis CT dated 12/04/2011. FINDINGS: Normal bowel gas pattern. Inferior pelvic phleboliths. Unremarkable bones. IMPRESSION: No acute abnormality. Electronically Signed  By: Beckie Salts M.D.   On: 03/10/2016 20:23     Imaging Review Dg Chest 2 View  03/21/2016  CLINICAL DATA:  Chest pain and right arm numbness beginning this morning. Initial encounter. EXAM: CHEST  2 VIEW COMPARISON:  PA and lateral chest 12/24/2014 and 01/02/2013. FINDINGS: The lungs are clear. Heart size is normal. There is no pneumothorax or pleural effusion. No focal bony abnormality. IMPRESSION: Negative chest. Electronically Signed   By: Drusilla Kanner M.D.   On: 03/21/2016 11:34   I have personally reviewed and evaluated these images and lab results as part of my medical decision-making.   ED ECG REPORT  I personally interpreted this EKG   Date: 03/21/2016   Rate: 76  Rhythm: normal sinus rhythm  QRS Axis: normal  Intervals: normal  ST/T Wave abnormalities: normal  Conduction Disutrbances:none  Narrative Interpretation:   Old EKG Reviewed: none available    MDM   Final diagnoses:  Chest pain, unspecified chest pain type    Patient with right-sided anterior chest pain. The pain is reproducible with palpation. Troponin is negative. EKG is normal. No evidence of ischemia. Chest x-ray is negative. Patient is PERC negative, doubt PE, ACS, or other emergent causes of chest pain. Vital signs are stable. Labs are reassuring. Will recommend continued outpatient  follow-up.     Roxy Horseman, PA-C 03/21/16 1603  Rolland Porter, MD 03/29/16 [phone]

## 2016-03-21 NOTE — Discharge Instructions (Signed)
Nonspecific Chest Pain  °Chest pain can be caused by many different conditions. There is always a chance that your pain could be related to something serious, such as a heart attack or a blood clot in your lungs. Chest pain can also be caused by conditions that are not life-threatening. If you have chest pain, it is very important to follow up with your health care provider. °CAUSES  °Chest pain can be caused by: °· Heartburn. °· Pneumonia or bronchitis. °· Anxiety or stress. °· Inflammation around your heart (pericarditis) or lung (pleuritis or pleurisy). °· A blood clot in your lung. °· A collapsed lung (pneumothorax). It can develop suddenly on its own (spontaneous pneumothorax) or from trauma to the chest. °· Shingles infection (varicella-zoster virus). °· Heart attack. °· Damage to the bones, muscles, and cartilage that make up your chest wall. This can include: °¨ Bruised bones due to injury. °¨ Strained muscles or cartilage due to frequent or repeated coughing or overwork. °¨ Fracture to one or more ribs. °¨ Sore cartilage due to inflammation (costochondritis). °RISK FACTORS  °Risk factors for chest pain may include: °· Activities that increase your risk for trauma or injury to your chest. °· Respiratory infections or conditions that cause frequent coughing. °· Medical conditions or overeating that can cause heartburn. °· Heart disease or family history of heart disease. °· Conditions or health behaviors that increase your risk of developing a blood clot. °· Having had chicken pox (varicella zoster). °SIGNS AND SYMPTOMS °Chest pain can feel like: °· Burning or tingling on the surface of your chest or deep in your chest. °· Crushing, pressure, aching, or squeezing pain. °· Dull or sharp pain that is worse when you move, cough, or take a deep breath. °· Pain that is also felt in your back, neck, shoulder, or arm, or pain that spreads to any of these areas. °Your chest pain may come and go, or it may stay  constant. °DIAGNOSIS °Lab tests or other studies may be needed to find the cause of your pain. Your health care provider may have you take a test called an ambulatory ECG (electrocardiogram). An ECG records your heartbeat patterns at the time the test is performed. You may also have other tests, such as: °· Transthoracic echocardiogram (TTE). During echocardiography, sound waves are used to create a picture of all of the heart structures and to look at how blood flows through your heart. °· Transesophageal echocardiogram (TEE). This is a more advanced imaging test that obtains images from inside your body. It allows your health care provider to see your heart in finer detail. °· Cardiac monitoring. This allows your health care provider to monitor your heart rate and rhythm in real time. °· Holter monitor. This is a portable device that records your heartbeat and can help to diagnose abnormal heartbeats. It allows your health care provider to track your heart activity for several days, if needed. °· Stress tests. These can be done through exercise or by taking medicine that makes your heart beat more quickly. °· Blood tests. °· Imaging tests. °TREATMENT  °Your treatment depends on what is causing your chest pain. Treatment may include: °· Medicines. These may include: °¨ Acid blockers for heartburn. °¨ Anti-inflammatory medicine. °¨ Pain medicine for inflammatory conditions. °¨ Antibiotic medicine, if an infection is present. °¨ Medicines to dissolve blood clots. °¨ Medicines to treat coronary artery disease. °· Supportive care for conditions that do not require medicines. This may include: °¨ Resting. °¨ Applying heat   or cold packs to injured areas. °¨ Limiting activities until pain decreases. °HOME CARE INSTRUCTIONS °· If you were prescribed an antibiotic medicine, finish it all even if you start to feel better. °· Avoid any activities that bring on chest pain. °· Do not use any tobacco products, including  cigarettes, chewing tobacco, or electronic cigarettes. If you need help quitting, ask your health care provider. °· Do not drink alcohol. °· Take medicines only as directed by your health care provider. °· Keep all follow-up visits as directed by your health care provider. This is important. This includes any further testing if your chest pain does not go away. °· If heartburn is the cause for your chest pain, you may be told to keep your head raised (elevated) while sleeping. This reduces the chance that acid will go from your stomach into your esophagus. °· Make lifestyle changes as directed by your health care provider. These may include: °¨ Getting regular exercise. Ask your health care provider to suggest some activities that are safe for you. °¨ Eating a heart-healthy diet. A registered dietitian can help you to learn healthy eating options. °¨ Maintaining a healthy weight. °¨ Managing diabetes, if necessary. °¨ Reducing stress. °SEEK MEDICAL CARE IF: °· Your chest pain does not go away after treatment. °· You have a rash with blisters on your chest. °· You have a fever. °SEEK IMMEDIATE MEDICAL CARE IF:  °· Your chest pain is worse. °· You have an increasing cough, or you cough up blood. °· You have severe abdominal pain. °· You have severe weakness. °· You faint. °· You have chills. °· You have sudden, unexplained chest discomfort. °· You have sudden, unexplained discomfort in your arms, back, neck, or jaw. °· You have shortness of breath at any time. °· You suddenly start to sweat, or your skin gets clammy. °· You feel nauseous or you vomit. °· You suddenly feel light-headed or dizzy. °· Your heart begins to beat quickly, or it feels like it is skipping beats. °These symptoms may represent a serious problem that is an emergency. Do not wait to see if the symptoms will go away. Get medical help right away. Call your local emergency services (911 in the [place] Do not drive yourself to the hospital. °  °This  information is not intended to replace advice given to you by your health care provider. Make sure you discuss any questions you have with your health care provider. °  °Document Released: 09/19/2005 Document Revised: 12/31/2014 Document Reviewed: 07/16/2014 °Elsevier Interactive Patient Education ©2016 [employer] ° °

## 2016-03-21 NOTE — ED Notes (Signed)
Per EMS, Pt has been having diarrhea x 1 month pt has been seen by GI and is scheduled for a CT soon. Pt started experiencing right sided CP today with right arm tingling. Pt reports CP has gotten better since this morning. Pt alert x4.

## 2016-03-22 ENCOUNTER — Ambulatory Visit
Admission: RE | Admit: 2016-03-22 | Discharge: 2016-03-22 | Disposition: A | Discharge status: Home / Self Care | Payer: 59 | Source: Ambulatory Visit | Attending: Gastroenterology | Admitting: Gastroenterology

## 2016-03-22 DIAGNOSIS — R197 Diarrhea, unspecified: Secondary | ICD-10-CM

## 2016-03-22 DIAGNOSIS — R1084 Generalized abdominal pain: Secondary | ICD-10-CM

## 2016-03-22 MED ORDER — IOPAMIDOL (ISOVUE-300) INJECTION 61%
125.0000 mL | Freq: Once | INTRAVENOUS | Status: AC | PRN
  Administered 2016-03-22: 125 mL via INTRAVENOUS

## 2016-03-26 ENCOUNTER — Other Ambulatory Visit: Payer: 59

## 2016-06-27 ENCOUNTER — Ambulatory Visit (HOSPITAL_COMMUNITY)
Admission: EM | Admit: 2016-06-27 | Discharge: 2016-06-27 | Disposition: A | Discharge status: Home / Self Care | Payer: 59 | Attending: Emergency Medicine | Admitting: Emergency Medicine

## 2016-06-27 ENCOUNTER — Encounter (HOSPITAL_COMMUNITY): Payer: Self-pay | Admitting: *Deleted

## 2016-06-27 DIAGNOSIS — J069 Acute upper respiratory infection, unspecified: Secondary | ICD-10-CM

## 2016-06-27 NOTE — Discharge Instructions (Signed)
May take Ibuprofen 600mg  every 6 hours as needed for body aches/pain. May take Zyrtec-D or Claritin-D as needed for congestion. Follow up with your primary care provider if symptoms worsen within 3 days.    Upper Respiratory Infection, Adult Most upper respiratory infections (URIs) are a viral infection of the air passages leading to the lungs. A URI affects the nose, throat, and upper air passages. The most common type of URI is nasopharyngitis and is typically referred to as "the common cold." URIs run their course and usually go away on their own. Most of the time, a URI does not require medical attention, but sometimes a bacterial infection in the upper airways can follow a viral infection. This is called a secondary infection. Sinus and middle ear infections are common types of secondary upper respiratory infections. Bacterial pneumonia can also complicate a URI. A URI can worsen asthma and chronic obstructive pulmonary disease (COPD). Sometimes, these complications can require emergency medical care and may be life threatening.  CAUSES Almost all URIs are caused by viruses. A virus is a type of germ and can spread from one person to another.  RISKS FACTORS You may be at risk for a URI if:   You smoke.   You have chronic heart or lung disease.  You have a weakened defense (immune) system.   You are very young or very old.   You have nasal allergies or asthma.  You work in crowded or poorly ventilated areas.  You work in health care facilities or schools. SIGNS AND SYMPTOMS  Symptoms typically develop 2-3 days after you come in contact with a cold virus. Most viral URIs last 7-10 days. However, viral URIs from the influenza virus (flu virus) can last 14-18 days and are typically more severe. Symptoms may include:   Runny or stuffy (congested) nose.   Sneezing.   Cough.   Sore throat.   Headache.   Fatigue.   Fever.   Loss of appetite.   Pain in your  forehead, behind your eyes, and over your cheekbones (sinus pain).  Muscle aches.  DIAGNOSIS  Your health care provider may diagnose a URI by:  Physical exam.  Tests to check that your symptoms are not due to another condition such as:  Strep throat.  Sinusitis.  Pneumonia.  Asthma. TREATMENT  A URI goes away on its own with time. It cannot be cured with medicines, but medicines may be prescribed or recommended to relieve symptoms. Medicines may help:  Reduce your fever.  Reduce your cough.  Relieve nasal congestion. HOME CARE INSTRUCTIONS   Take medicines only as directed by your health care provider.   Gargle warm saltwater or take cough drops to comfort your throat as directed by your health care provider.  Use a warm mist humidifier or inhale steam from a shower to increase air moisture. This may make it easier to breathe.  Drink enough fluid to keep your urine clear or pale yellow.   Eat soups and other clear broths and maintain good nutrition.   Rest as needed.   Return to work when your temperature has returned to normal or as your health care provider advises. You may need to stay home longer to avoid infecting others. You can also use a face mask and careful hand washing to prevent spread of the virus.  Increase the usage of your inhaler if you have asthma.   Do not use any tobacco products, including cigarettes, chewing tobacco, or electronic cigarettes. If  you need help quitting, ask your health care provider. PREVENTION  The best way to protect yourself from getting a cold is to practice good hygiene.   Avoid oral or hand contact with people with cold symptoms.   Wash your hands often if contact occurs.  There is no clear evidence that vitamin C, vitamin E, echinacea, or exercise reduces the chance of developing a cold. However, it is always recommended to get plenty of rest, exercise, and practice good nutrition.  SEEK MEDICAL CARE IF:   You  are getting worse rather than better.   Your symptoms are not controlled by medicine.   You have chills.  You have worsening shortness of breath.  You have brown or red mucus.  You have yellow or brown nasal discharge.  You have pain in your face, especially when you bend forward.  You have a fever.  You have swollen neck glands.  You have pain while swallowing.  You have white areas in the back of your throat. SEEK IMMEDIATE MEDICAL CARE IF:   You have severe or persistent:  Headache.  Ear pain.  Sinus pain.  Chest pain.  You have chronic lung disease and any of the following:  Wheezing.  Prolonged cough.  Coughing up blood.  A change in your usual mucus.  You have a stiff neck.  You have changes in your:  Vision.  Hearing.  Thinking.  Mood. MAKE SURE YOU:   Understand these instructions.  Will watch your condition.  Will get help right away if you are not doing well or get worse.   This information is not intended to replace advice given to you by your health care provider. Make sure you discuss any questions you have with your health care provider.   Document Released: 06/05/2001 Document Revised: 04/26/2015 Document Reviewed: 03/17/2014 Elsevier Interactive Patient Education [employer]2016 Elsevier Inc.

## 2016-06-27 NOTE — ED Notes (Signed)
Pt  Reports  Symptoms  Of  Body  Aches      Headache     sorethroat      Symptoms  Started  Yesterday        Reports   Symptoms  Of runny  Nose   As  Well

## 2016-06-28 NOTE — ED Provider Notes (Signed)
CSN: [id number]     Arrival date & time 06/27/16  1834 History   First MD Initiated Contact with Patient 06/27/16 2013     Chief Complaint  Patient presents with  . Generalized Body Aches   (Consider location/radiation/quality/duration/timing/severity/associated sxs/prior Treatment) HPI Comments: Patient presents with body aches, nasal congestion, sore throat and slight headache that started yesterday. Also low grade fever. No cough or GI symptoms. Has not taken any medication since she wanted to be evaluated before taking OTC meds.   Patient is a 35 y.o. female presenting with URI. The history is provided by the patient.  URI Presenting symptoms: congestion, fatigue, fever, rhinorrhea and sore throat   Congestion:    Location:  Nasal Severity:  Mild Onset quality:  Gradual Duration:  1 day Timing:  Constant Progression:  Unchanged Chronicity:  New Associated symptoms: myalgias     Past Medical History  Diagnosis Date  . Medical history non-contributory   . Headache   . Anxiety   . Ectopic pregnancy     x2   Past Surgical History  Procedure Laterality Date  . Laparoscopy N/A 01/07/2015    Procedure: LAPAROSCOPY DIAGNOSTIC WITH REMOVAL OF ECTOPIC PREGNANCY;  Surgeon: Jaymes GraffNaima Dillard, MD;  Location: [facility];  Service: Gynecology;  Laterality: N/A;  . Unilateral salpingectomy Left 01/07/2015    Procedure: LEFT SALPINGECTOMY;  Surgeon: Jaymes GraffNaima Dillard, MD;  Location: [facility];  Service: Gynecology;  Laterality: Left;  . Therapeutic abortion    . Laparoscopy Right 09/14/2015    Procedure: LAPAROSCOPY TREATMENT OF ECTOPIC PREGNANCY WITH SALPINGOSTOMY;  Surgeon: Silverio LaySandra Rivard, MD;  Location: [facility];  Service: Gynecology;  Laterality: Right;  with removal of ectopic pregnancy   . Dilation and evacuation      ectopic pregnancy   Family History  Problem Relation Age of Onset  . Diabetes Father    Social History  Substance Use Topics  . Smoking status: Never Smoker   . Smokeless tobacco:  Never Used  . Alcohol Use: No     Comment: occ wine   OB History    Gravida Para Term Preterm AB TAB SAB Ectopic Multiple Living   4    3 1  0 2  0     Review of Systems  Constitutional: Positive for fever and fatigue.  HENT: Positive for congestion, rhinorrhea and sore throat.   Musculoskeletal: Positive for myalgias.    Allergies  Penicillins  Home Medications   Prior to Admission medications   Medication Sig Start Date End Date Taking? Authorizing Provider  amitriptyline (ELAVIL) 10 MG tablet Take 10 mg by mouth at bedtime.    Historical Provider, MD  ibuprofen (ADVIL,MOTRIN) 600 MG tablet Take 1 tablet (600 mg total) by mouth every 6 (six) hours as needed. 03/21/16   Roxy Horsemanobert Browning, PA-C  LORazepam (ATIVAN) 1 MG tablet Take 1 tablet (1 mg total) by mouth every 8 (eight) hours as needed for anxiety. 03/21/16   Roxy Horsemanobert Browning, PA-C  ondansetron (ZOFRAN) 4 MG tablet Take 1 tablet (4 mg total) by mouth every 8 (eight) hours as needed for nausea or vomiting. 03/13/16 03/13/17  Alphonzo Severanceachel Stall, CNM  Prenatal Vit-Fe Fumarate-FA (PRENATAL MULTIVITAMIN) TABS tablet Take 1 tablet by mouth daily at 12 noon.    Historical Provider, MD  Zolpidem Tartrate (AMBIEN PO) Take 1 tablet by mouth at bedtime.    Historical Provider, MD   Meds Ordered and Administered this Visit  Medications - No data to display  BP 107/59 mmHg  Pulse 72  Temp(Src) 98.8 F (37.1 C) (Oral)  Resp 12  SpO2 100%  LMP 06/01/2016 No data found.   Physical Exam  Constitutional: She is oriented to person, place, and time. She appears well-developed and well-nourished. She does not appear ill. No distress.  HENT:  Head: Normocephalic and atraumatic.  Right Ear: Hearing, tympanic membrane, external ear and ear canal normal.  Left Ear: Hearing, tympanic membrane, external ear and ear canal normal.  Nose: Rhinorrhea present. Right sinus exhibits no maxillary sinus tenderness and no frontal sinus tenderness. Left sinus  exhibits no maxillary sinus tenderness and no frontal sinus tenderness.  Mouth/Throat: Uvula is midline and mucous membranes are normal. Posterior oropharyngeal erythema present.  Neck: Normal range of motion. Neck supple.  Cardiovascular: Normal rate, regular rhythm and normal heart sounds.   Pulmonary/Chest: Effort normal and breath sounds normal. She has no wheezes.  Lymphadenopathy:    She has no cervical adenopathy.  Neurological: She is alert and oriented to person, place, and time.  Skin: Skin is warm and dry.    ED Course  Procedures (including critical care time)  Labs Review Labs Reviewed - No data to display  Imaging Review No results found.   Visual Acuity Review  Right Eye Distance:   Left Eye Distance:   Bilateral Distance:    Right Eye Near:   Left Eye Near:    Bilateral Near:         MDM   1. URI (upper respiratory infection)    Reviewed with patient that she probably has a viral illness or "the common cold". Encouraged to increase fluid intake and may take Ibuprofen 600mg  every 6 hours for body aches and headache. May take OTC Zyrtec-D for drainage and congestion. Encouraged rest. Recommend follow-up with her PCP in 5 to 6 days if symptoms do not resolve.     Sudie GrumblingAnn Berry Dameka Younker, NP 06/28/16 [phone]

## 2016-07-02 ENCOUNTER — Emergency Department (HOSPITAL_COMMUNITY)
Admission: EM | Admit: 2016-07-02 | Discharge: 2016-07-02 | Disposition: A | Discharge status: Home / Self Care | Payer: 59 | Attending: Emergency Medicine | Admitting: Emergency Medicine

## 2016-07-02 ENCOUNTER — Emergency Department (HOSPITAL_COMMUNITY): Payer: 59

## 2016-07-02 ENCOUNTER — Encounter (HOSPITAL_COMMUNITY): Payer: Self-pay | Admitting: Emergency Medicine

## 2016-07-02 DIAGNOSIS — J069 Acute upper respiratory infection, unspecified: Secondary | ICD-10-CM | POA: Insufficient documentation

## 2016-07-02 DIAGNOSIS — R05 Cough: Secondary | ICD-10-CM | POA: Diagnosis present

## 2016-07-02 DIAGNOSIS — Z791 Long term (current) use of non-steroidal anti-inflammatories (NSAID): Secondary | ICD-10-CM | POA: Insufficient documentation

## 2016-07-02 DIAGNOSIS — Z792 Long term (current) use of antibiotics: Secondary | ICD-10-CM | POA: Insufficient documentation

## 2016-07-02 DIAGNOSIS — Z79899 Other long term (current) drug therapy: Secondary | ICD-10-CM | POA: Insufficient documentation

## 2016-07-02 LAB — RAPID STREP SCREEN (MED CTR MEBANE ONLY): Streptococcus, Group A Screen (Direct): NEGATIVE

## 2016-07-02 MED ORDER — PSEUDOEPHEDRINE HCL ER 120 MG PO TB12: 120.0000 mg | ORAL_TABLET | Freq: Two times a day (BID) | ORAL | Status: DC

## 2016-07-02 MED ORDER — BENZONATATE 100 MG PO CAPS: 100.0000 mg | ORAL_CAPSULE | Freq: Three times a day (TID) | ORAL | Status: DC

## 2016-07-02 NOTE — ED Provider Notes (Signed)
CSN: [id number]     Arrival date & time 07/02/16  [id number] History   First MD Initiated Contact with Patient 07/02/16 1002     Chief Complaint  Patient presents with  . Cough  . URI    HPI Pt started having flu like symptoms on July 4th.  Sx include cough, sore throat and body aches.  No known fevers.  Cough is productive of yellow sputum.  Chest hurts with deep breathing.  No vomiting some loose stools. Sx are often worse at night.  No recent travel.  No known ill contacts as far as she knows.  Childhood immun up to date.  She went to an urgent care on the 5th.  Dx with a uri.  Feels that sx are worse.  Called pcp but no appointment until July 31st. Past Medical History  Diagnosis Date  . Medical history non-contributory   . Headache   . Anxiety   . Ectopic pregnancy     x2   Past Surgical History  Procedure Laterality Date  . Laparoscopy N/A 01/07/2015    Procedure: LAPAROSCOPY DIAGNOSTIC WITH REMOVAL OF ECTOPIC PREGNANCY;  Surgeon: Jaymes GraffNaima Dillard, MD;  Location: [facility];  Service: Gynecology;  Laterality: N/A;  . Unilateral salpingectomy Left 01/07/2015    Procedure: LEFT SALPINGECTOMY;  Surgeon: Jaymes GraffNaima Dillard, MD;  Location: [facility];  Service: Gynecology;  Laterality: Left;  . Therapeutic abortion    . Laparoscopy Right 09/14/2015    Procedure: LAPAROSCOPY TREATMENT OF ECTOPIC PREGNANCY WITH SALPINGOSTOMY;  Surgeon: Silverio LaySandra Rivard, MD;  Location: [facility];  Service: Gynecology;  Laterality: Right;  with removal of ectopic pregnancy   . Dilation and evacuation      ectopic pregnancy   Family History  Problem Relation Age of Onset  . Diabetes Father    Social History  Substance Use Topics  . Smoking status: Never Smoker   . Smokeless tobacco: Never Used  . Alcohol Use: No     Comment: occ wine   OB History    Gravida Para Term Preterm AB TAB SAB Ectopic Multiple Living   4    3 1  0 2  0     Review of Systems  All other systems reviewed and are negative.     Allergies   Penicillins  Home Medications   Prior to Admission medications   Medication Sig Start Date End Date Taking? Authorizing Provider  guaiFENesin (MUCINEX) 600 MG 12 hr tablet Take 1,200 mg by mouth every 4 (four) hours as needed for cough.   Yes Historical Provider, MD  ibuprofen (ADVIL,MOTRIN) 600 MG tablet Take 1 tablet (600 mg total) by mouth every 6 (six) hours as needed. 03/21/16  Yes Roxy Horsemanobert Browning, PA-C  Multiple Vitamin (MULTIVITAMIN WITH MINERALS) TABS tablet Take 1 tablet by mouth daily.   Yes Historical Provider, MD  Omega-3 Fatty Acids (OMEGA 3 PO) Take 2 capsules by mouth daily.   Yes Historical Provider, MD  OVER THE COUNTER MEDICATION Take 2 capsules by mouth daily. Beet Root   Yes Historical Provider, MD  Probiotic Product (PROBIOTIC PO) Take 1 capsule by mouth 2 (two) times daily.   Yes Historical Provider, MD  valACYclovir (VALTREX) 500 MG tablet Take 500 mg by mouth daily as needed (coldsores).  06/01/16  Yes Historical Provider, MD  zolpidem (AMBIEN) 10 MG tablet Take 10 mg by mouth at bedtime.  06/04/16  Yes Historical Provider, MD  benzonatate (TESSALON) 100 MG capsule Take 1 capsule (100 mg total) by mouth  every 8 (eight) hours. 07/02/16   Linwood Dibbles, MD  LORazepam (ATIVAN) 1 MG tablet Take 1 tablet (1 mg total) by mouth every 8 (eight) hours as needed for anxiety. Patient not taking: Reported on 07/02/2016 03/21/16   Roxy Horseman, PA-C  ondansetron (ZOFRAN) 4 MG tablet Take 1 tablet (4 mg total) by mouth every 8 (eight) hours as needed for nausea or vomiting. Patient not taking: Reported on 07/02/2016 03/13/16 03/13/17  Alphonzo Severance, CNM  pseudoephedrine (SUDAFED 12 HOUR) 120 MG 12 hr tablet Take 1 tablet (120 mg total) by mouth every 12 (twelve) hours. 07/02/16   Linwood Dibbles, MD   BP 124/88 mmHg  Pulse 72  Temp(Src) 98.5 F (36.9 C) (Oral)  Resp 13  SpO2 100%  LMP 07/02/2016 Physical Exam  Constitutional: She appears well-developed and well-nourished. No distress.  HENT:   Head: Normocephalic and atraumatic.  Right Ear: External ear normal.  Left Ear: External ear normal.  Mouth/Throat: No oropharyngeal exudate (mild erythema).  Eyes: Conjunctivae are normal. Right eye exhibits no discharge. Left eye exhibits no discharge. No scleral icterus.  Neck: Neck supple. No tracheal deviation present.  Cardiovascular: Normal rate, regular rhythm and intact distal pulses.   Pulmonary/Chest: Effort normal and breath sounds normal. No stridor. No respiratory distress. She has no wheezes. She has no rales.  Abdominal: Soft. Bowel sounds are normal. She exhibits no distension. There is no tenderness. There is no rebound and no guarding.  Musculoskeletal: She exhibits no edema or tenderness.  Neurological: She is alert. She has normal strength. No cranial nerve deficit (no facial droop, extraocular movements intact, no slurred speech) or sensory deficit. She exhibits normal muscle tone. She displays no seizure activity. Coordination normal.  Skin: Skin is warm and dry. No rash noted.  Psychiatric: She has a normal mood and affect.  Nursing note and vitals reviewed.   ED Course  Procedures (including critical care time) Labs Review Labs Reviewed  RAPID STREP SCREEN (NOT AT [facility])  CULTURE, GROUP A STREP [facility])    Imaging Review Dg Chest 2 View  07/02/2016  CLINICAL DATA:  Cough and chest congestion.  Shortness of breath. EXAM: CHEST  2 VIEW COMPARISON:  03/21/2016 FINDINGS: The heart size and mediastinal contours are within normal limits. Both lungs are clear. The visualized skeletal structures are unremarkable. IMPRESSION: Normal chest. Electronically Signed   By: Francene Boyers M.D.   On: 07/02/2016 10:51   I have personally reviewed and evaluated these images and lab results as part of my medical decision-making.    MDM   Final diagnoses:  URI, acute    Symptoms are consistent with a viral  respiratory infection. There is no evidence to suggest pneumonia on  my exam or on xray. The patient does not appear to have an otitis media. I discussed supportive treatment. I encouraged followup with the primary care doctor next week if symptoms have not resolved. Warning signs and reasons to return to the emergency room were discussed  Rx sudafed and tessalon     Linwood Dibbles, MD 07/02/16 1144

## 2016-07-02 NOTE — Discharge Instructions (Signed)
Upper Respiratory Infection, Adult Most upper respiratory infections (URIs) are a viral infection of the air passages leading to the lungs. A URI affects the nose, throat, and upper air passages. The most common type of URI is nasopharyngitis and is typically referred to as "the common cold." URIs run their course and usually go away on their own. Most of the time, a URI does not require medical attention, but sometimes a bacterial infection in the upper airways can follow a viral infection. This is called a secondary infection. Sinus and middle ear infections are common types of secondary upper respiratory infections. Bacterial pneumonia can also complicate a URI. A URI can worsen asthma and chronic obstructive pulmonary disease (COPD). Sometimes, these complications can require emergency medical care and may be life threatening.  CAUSES Almost all URIs are caused by viruses. A virus is a type of germ and can spread from one person to another.  RISKS FACTORS You may be at risk for a URI if:   You smoke.   You have chronic heart or lung disease.  You have a weakened defense (immune) system.   You are very young or very old.   You have nasal allergies or asthma.  You work in crowded or poorly ventilated areas.  You work in health care facilities or schools. SIGNS AND SYMPTOMS  Symptoms typically develop 2-3 days after you come in contact with a cold virus. Most viral URIs last 7-10 days. However, viral URIs from the influenza virus (flu virus) can last 14-18 days and are typically more severe. Symptoms may include:   Runny or stuffy (congested) nose.   Sneezing.   Cough.   Sore throat.   Headache.   Fatigue.   Fever.   Loss of appetite.   Pain in your forehead, behind your eyes, and over your cheekbones (sinus pain).  Muscle aches.  DIAGNOSIS  Your health care provider may diagnose a URI by:  Physical exam.  Tests to check that your symptoms are not due to  another condition such as:  Strep throat.  Sinusitis.  Pneumonia.  Asthma. TREATMENT  A URI goes away on its own with time. It cannot be cured with medicines, but medicines may be prescribed or recommended to relieve symptoms. Medicines may help:  Reduce your fever.  Reduce your cough.  Relieve nasal congestion. HOME CARE INSTRUCTIONS   Take medicines only as directed by your health care provider.   Gargle warm saltwater or take cough drops to comfort your throat as directed by your health care provider.  Use a warm mist humidifier or inhale steam from a shower to increase air moisture. This may make it easier to breathe.  Drink enough fluid to keep your urine clear or pale yellow.   Eat soups and other clear broths and maintain good nutrition.   Rest as needed.   Return to work when your temperature has returned to normal or as your health care provider advises. You may need to stay home longer to avoid infecting others. You can also use a face mask and careful hand washing to prevent spread of the virus.  Increase the usage of your inhaler if you have asthma.   Do not use any tobacco products, including cigarettes, chewing tobacco, or electronic cigarettes. If you need help quitting, ask your health care provider. PREVENTION  The best way to protect yourself from getting a cold is to practice good hygiene.   Avoid oral or hand contact with people with cold   symptoms.   Wash your hands often if contact occurs.  There is no clear evidence that vitamin C, vitamin E, echinacea, or exercise reduces the chance of developing a cold. However, it is always recommended to get plenty of rest, exercise, and practice good nutrition.  SEEK MEDICAL CARE IF:   You are getting worse rather than better.   Your symptoms are not controlled by medicine.   You have chills.  You have worsening shortness of breath.  You have brown or red mucus.  You have yellow or brown nasal  discharge.  You have pain in your face, especially when you bend forward.  You have a fever.  You have swollen neck glands.  You have pain while swallowing.  You have white areas in the back of your throat. SEEK IMMEDIATE MEDICAL CARE IF:   You have severe or persistent:  Headache.  Ear pain.  Sinus pain.  Chest pain.  You have chronic lung disease and any of the following:  Wheezing.  Prolonged cough.  Coughing up blood.  A change in your usual mucus.  You have a stiff neck.  You have changes in your:  Vision.  Hearing.  Thinking.  Mood. MAKE SURE YOU:   Understand these instructions.  Will watch your condition.  Will get help right away if you are not doing well or get worse.   This information is not intended to replace advice given to you by your health care provider. Make sure you discuss any questions you have with your health care provider.   Document Released: 06/05/2001 Document Revised: 04/26/2015 Document Reviewed: 03/17/2014 Elsevier Interactive Patient Education 2016 [employer] Inc[employer]  

## 2016-07-02 NOTE — ED Notes (Signed)
Bed: WA02 Expected date:  Expected time:  Means of arrival:  Comments: 

## 2016-07-02 NOTE — ED Notes (Signed)
[facility] for cough, sore throat, and generalized body aches. Pt was told to take Ibuprofen and dx with an upper respiratory infection. Pt presents to ED today with same/worsening symptoms. Pt c/o productive cough with yellow sputum, generalized body aches, and chest pressure/tightness when coughing or taking deep breaths. Pt sts symptoms are worse at night. A&Ox4 and ambulatory.

## 2016-07-05 LAB — CULTURE, GROUP A STREP (THRC)

## 2017-03-27 ENCOUNTER — Ambulatory Visit (HOSPITAL_COMMUNITY)
Admission: EM | Admit: 2017-03-27 | Discharge: 2017-03-27 | Disposition: A | Discharge status: Home / Self Care | Payer: 59 | Attending: Family Medicine | Admitting: Family Medicine

## 2017-03-27 ENCOUNTER — Encounter (HOSPITAL_COMMUNITY): Payer: Self-pay | Admitting: Family Medicine

## 2017-03-27 DIAGNOSIS — J40 Bronchitis, not specified as acute or chronic: Secondary | ICD-10-CM

## 2017-03-27 DIAGNOSIS — R05 Cough: Secondary | ICD-10-CM

## 2017-03-27 DIAGNOSIS — J029 Acute pharyngitis, unspecified: Secondary | ICD-10-CM

## 2017-03-27 DIAGNOSIS — R059 Cough, unspecified: Secondary | ICD-10-CM

## 2017-03-27 MED ORDER — AZITHROMYCIN 250 MG PO TABS: 250.0000 mg | ORAL_TABLET | Freq: Every day | ORAL | 0 refills | Status: DC

## 2017-03-27 MED ORDER — BENZONATATE 100 MG PO CAPS: 200.0000 mg | ORAL_CAPSULE | Freq: Three times a day (TID) | ORAL | 0 refills | Status: DC | PRN

## 2017-03-27 NOTE — ED Triage Notes (Signed)
Pt here for cough and congestion that started last night. sts soe burning in throat. sts some nausea.

## 2017-03-27 NOTE — ED Provider Notes (Signed)
CSN: [id number]     Arrival date & time 03/27/17  1055 History   First MD Initiated Contact with Patient 03/27/17 1221     Chief Complaint  Patient presents with  . Cough  . Nasal Congestion   (Consider location/radiation/quality/duration/timing/severity/associated sxs/prior Treatment) Patient c/o cough and congestion starting last night and havign buring sore throat.   The history is provided by the patient.  Cough  Cough characteristics:  Productive Sputum characteristics:  White Severity:  Moderate Onset quality:  Sudden Duration:  2 days Timing:  Constant Progression:  Worsening Chronicity:  New Smoker: no   Context: upper respiratory infection and weather changes   Relieved by:  Nothing Worsened by:  Nothing Associated symptoms: sore throat     Past Medical History:  Diagnosis Date  . Anxiety   . Ectopic pregnancy    x2  . Headache   . Medical history non-contributory    Past Surgical History:  Procedure Laterality Date  . DILATION AND EVACUATION     ectopic pregnancy  . LAPAROSCOPY N/A 01/07/2015   Procedure: LAPAROSCOPY DIAGNOSTIC WITH REMOVAL OF ECTOPIC PREGNANCY;  Surgeon: Jaymes Graff, MD;  Location: [facility];  Service: Gynecology;  Laterality: N/A;  . LAPAROSCOPY Right 09/14/2015   Procedure: LAPAROSCOPY TREATMENT OF ECTOPIC PREGNANCY WITH SALPINGOSTOMY;  Surgeon: Silverio Lay, MD;  Location: [facility];  Service: Gynecology;  Laterality: Right;  with removal of ectopic pregnancy   . THERAPEUTIC ABORTION    . UNILATERAL SALPINGECTOMY Left 01/07/2015   Procedure: LEFT SALPINGECTOMY;  Surgeon: Jaymes Graff, MD;  Location: [facility];  Service: Gynecology;  Laterality: Left;   Family History  Problem Relation Age of Onset  . Diabetes Father    Social History  Substance Use Topics  . Smoking status: Never Smoker  . Smokeless tobacco: Never Used  . Alcohol use No     Comment: occ wine   OB History    Gravida Para Term Preterm AB Living   4       3 0   SAB TAB  Ectopic Multiple Live Births   0 1 2         Review of Systems  Constitutional: Positive for fatigue.  HENT: Positive for sore throat.   Eyes: Negative.   Respiratory: Positive for cough.   Cardiovascular: Negative.   Gastrointestinal: Negative.   Endocrine: Negative.   Genitourinary: Negative.   Musculoskeletal: Negative.   Allergic/Immunologic: Negative.   Neurological: Negative.   Hematological: Negative.   Psychiatric/Behavioral: Negative.     Allergies  Penicillins  Home Medications   Prior to Admission medications   Medication Sig Start Date End Date Taking? Authorizing Provider  azithromycin (ZITHROMAX) 250 MG tablet Take 1 tablet (250 mg total) by mouth daily. Take first 2 tablets together, then 1 every day until finished. 03/27/17   Deatra Canter, FNP  benzonatate (TESSALON) 100 MG capsule Take 2 capsules (200 mg total) by mouth 3 (three) times daily as needed for cough. 03/27/17   Deatra Canter, FNP  zolpidem (AMBIEN) 10 MG tablet Take 10 mg by mouth at bedtime.  06/04/16   Historical Provider, MD   Meds Ordered and Administered this Visit  Medications - No data to display  BP 128/78   Pulse 80   Temp 99 F (37.2 C)   Resp 18   LMP 03/04/2017   SpO2 100%  No data found.   Physical Exam  Constitutional: She appears well-developed and well-nourished.  HENT:  Head: Normocephalic and  atraumatic.  Right Ear: External ear normal.  Left Ear: External ear normal.  Mouth/Throat: Oropharynx is clear and moist.  Eyes: Conjunctivae and EOM are normal. Pupils are equal, round, and reactive to light.  Neck: Normal range of motion. Neck supple.  Cardiovascular: Normal rate, regular rhythm and normal heart sounds.   Pulmonary/Chest: Effort normal and breath sounds normal.  Abdominal: Soft. Bowel sounds are normal.  Nursing note and vitals reviewed.   Urgent Care Course     Procedures (including critical care time)  Labs Review Labs Reviewed - No data to  display  Imaging Review No results found.   Visual Acuity Review  Right Eye Distance:   Left Eye Distance:   Bilateral Distance:    Right Eye Near:   Left Eye Near:    Bilateral Near:         MDM   1. Cough   2. Bronchitis   3. Sore throat    Zpak Tessalon Perles  Push po fluids, rest, tylenol and motrin otc prn as directed for fever, arthralgias, and myalgias.  Follow up prn if sx's continue or persist.    Deatra Canter, FNP 03/27/17 1243

## 2018-04-08 ENCOUNTER — Ambulatory Visit (HOSPITAL_COMMUNITY)
Admission: EM | Admit: 2018-04-08 | Discharge: 2018-04-08 | Disposition: A | Discharge status: Home / Self Care | Payer: 59 | Attending: Family Medicine | Admitting: Family Medicine

## 2018-04-08 ENCOUNTER — Encounter (HOSPITAL_COMMUNITY): Payer: Self-pay | Admitting: Emergency Medicine

## 2018-04-08 DIAGNOSIS — R21 Rash and other nonspecific skin eruption: Secondary | ICD-10-CM | POA: Diagnosis not present

## 2018-04-08 LAB — POCT PREGNANCY, URINE: PREG TEST UR: NEGATIVE

## 2018-04-08 MED ORDER — PREDNISONE 50 MG PO TABS: 50.0000 mg | ORAL_TABLET | Freq: Every day | ORAL | 0 refills | Status: AC

## 2018-04-08 MED ORDER — CETIRIZINE HCL 10 MG PO CAPS: 10.0000 mg | ORAL_CAPSULE | Freq: Two times a day (BID) | ORAL | 0 refills | Status: AC

## 2018-04-08 NOTE — ED Provider Notes (Signed)
[facility]    CSN: [id number] Arrival date & time: 04/08/18  1633     History   Chief Complaint Chief Complaint  Patient presents with  . Rash    HPI Latoya Ibarra is a 37 y.o. female presenting today with concern for rash.  States that she has been breaking out in small red bumps on her arms and legs beginning yesterday.  They have been associated with a burning sensation as well as some itching.  She has not taken anything for her symptoms yet.  She denies any new soaps, lotions, detergents, foods.  She does note that last Monday she began to use "yoni" perles for fertility?  She notes that she is 3 days late for her cycle and is also requesting a pregnancy test.  HPI  Past Medical History:  Diagnosis Date  . Anxiety   . Ectopic pregnancy    x2  . Headache   . Medical history non-contributory     Patient Active Problem List   Diagnosis Date Noted  . Hx of ectopic pregnancy--"years ago", unknown laterality, no treatment 01/07/2015  . Allergy to penicillin 01/07/2015  . Migraines 01/07/2015  . Ectopic pregnancy 01/08/15--removal left tube 01/07/2015    Past Surgical History:  Procedure Laterality Date  . DILATION AND EVACUATION     ectopic pregnancy  . LAPAROSCOPY N/A 01/07/2015   Procedure: LAPAROSCOPY DIAGNOSTIC WITH REMOVAL OF ECTOPIC PREGNANCY;  Surgeon: Jaymes GraffNaima Dillard, MD;  Location: [facility];  Service: Gynecology;  Laterality: N/A;  . LAPAROSCOPY Right 09/14/2015   Procedure: LAPAROSCOPY TREATMENT OF ECTOPIC PREGNANCY WITH SALPINGOSTOMY;  Surgeon: Silverio LaySandra Rivard, MD;  Location: [facility];  Service: Gynecology;  Laterality: Right;  with removal of ectopic pregnancy   . THERAPEUTIC ABORTION    . UNILATERAL SALPINGECTOMY Left 01/07/2015   Procedure: LEFT SALPINGECTOMY;  Surgeon: Jaymes GraffNaima Dillard, MD;  Location: [facility];  Service: Gynecology;  Laterality: Left;    OB History    Gravida  4   Para      Term      Preterm      AB  3   Living  0     SAB    0   TAB  1   Ectopic  2   Multiple      Live Births               Home Medications    Prior to Admission medications   Medication Sig Start Date End Date Taking? Authorizing Provider  Cetirizine HCl 10 MG CAPS Take 1 capsule (10 mg total) by mouth 2 (two) times daily for 5 days. 04/08/18 04/13/18  Alonnah Lampkins C, PA-C  predniSONE (DELTASONE) 50 MG tablet Take 1 tablet (50 mg total) by mouth daily for 4 days. 04/08/18 04/12/18  Cyruss Arata C, PA-C  zolpidem (AMBIEN) 10 MG tablet Take 10 mg by mouth at bedtime.  06/04/16   [provider]    Family History Family History  Problem Relation Age of Onset  . Diabetes Father     Social History Social History   Tobacco Use  . Smoking status: Never Smoker  . Smokeless tobacco: Never Used  Substance Use Topics  . Alcohol use: No    Comment: occ wine  . Drug use: No     Allergies   Penicillins   Review of Systems Review of Systems  Constitutional: Negative for activity change, appetite change, fatigue and fever.  Gastrointestinal: Negative for abdominal pain, nausea  and vomiting.  Genitourinary: Positive for menstrual problem.  Musculoskeletal: Negative for arthralgias, myalgias and neck pain.  Skin: Positive for color change and rash.  Neurological: Negative for weakness, numbness and headaches.     Physical Exam Triage Vital Signs ED Triage Vitals [04/08/18 1645]  Enc Vitals Group     BP 135/81     Pulse Rate 79     Resp 18     Temp 98.4 F (36.9 C)     Temp src      SpO2 100 %     Weight      Height      Head Circumference      Peak Flow      Pain Score 0     Pain Loc      Pain Edu?      Excl. in GC?    No data found.  Updated Vital Signs BP 135/81   Pulse 79   Temp 98.4 F (36.9 C)   Resp 18   SpO2 100%   Visual Acuity Right Eye Distance:   Left Eye Distance:   Bilateral Distance:    Right Eye Near:   Left Eye Near:    Bilateral Near:     Physical Exam   Constitutional: She appears well-developed and well-nourished. No distress.  HENT:  Head: Normocephalic and atraumatic.  Mouth/Throat: Oropharynx is clear and moist.  Eyes: Conjunctivae are normal.  Neck: Neck supple.  Cardiovascular: Normal rate and regular rhythm.  No murmur heard. Pulmonary/Chest: Effort normal. No respiratory distress.  Breathing comfortably at rest  Musculoskeletal: She exhibits no edema.  Neurological: She is alert.  Skin: Skin is warm and dry. Rash noted. There is erythema.  Small papular rash with overlying/surrounding erythema on bilateral upper and lower extremities.  Does not involve trunk or face.  Does not involve oral mucosa  Psychiatric: She has a normal mood and affect.  Nursing note and vitals reviewed.    UC Treatments / Results  Labs (all labs ordered are listed, but only abnormal results are displayed) Labs Reviewed  POCT PREGNANCY, URINE    EKG None Radiology No results found.  Procedures Procedures (including critical care time)  Medications Ordered in UC Medications - No data to display   Initial Impression / Assessment and Plan / UC Course  I have reviewed the triage vital signs and the nursing notes.  Pertinent labs & imaging results that were available during my care of the patient were reviewed by me and considered in my medical decision making (see chart for details).     Patient with rash, possible allergic reaction.  Appears to be slightly improved compared to earlier today.  Will provide 4 days of prednisone, also advised Zyrtec/Pepcid/Benadryl to help with itching. Discussed strict return precautions. Patient verbalized understanding and is agreeable with plan.   Final Clinical Impressions(s) / UC Diagnoses   Final diagnoses:  Rash and nonspecific skin eruption    ED Discharge Orders        Ordered    predniSONE (DELTASONE) 50 MG tablet  Daily     04/08/18 1707    Cetirizine HCl 10 MG CAPS  2 times daily      04/08/18 1707       Controlled Substance Prescriptions [place] Controlled Substance Registry consulted? Not Applicable   Lew Dawes, [place] 04/08/18 1717

## 2018-04-08 NOTE — ED Triage Notes (Signed)
Pt c/o itchy rash, "breaking out in hives" since yesterday.

## 2018-04-08 NOTE — Discharge Instructions (Signed)
Pregnancy test was negative.  This rash is likely an allergic reaction to something.  This should slowly resolve as the irritant is removed from the body.  Please take prednisone daily for the next 4 days.  Please use daily Zyrtec, may use twice a day.  May also try Benadryl for itching.  As well as supplementing with famotidine or Pepcid.

## 2018-05-09 ENCOUNTER — Other Ambulatory Visit: Payer: Self-pay

## 2018-05-09 ENCOUNTER — Encounter (HOSPITAL_COMMUNITY): Payer: Self-pay | Admitting: *Deleted

## 2018-05-09 ENCOUNTER — Inpatient Hospital Stay (HOSPITAL_COMMUNITY)
Admission: AD | Admit: 2018-05-09 | Discharge: 2018-05-09 | Disposition: A | Discharge status: Home / Self Care | Payer: 59 | Source: Ambulatory Visit | Attending: Obstetrics | Admitting: Obstetrics

## 2018-05-09 DIAGNOSIS — Z79899 Other long term (current) drug therapy: Secondary | ICD-10-CM | POA: Diagnosis not present

## 2018-05-09 DIAGNOSIS — F419 Anxiety disorder, unspecified: Secondary | ICD-10-CM | POA: Insufficient documentation

## 2018-05-09 DIAGNOSIS — Z3202 Encounter for pregnancy test, result negative: Secondary | ICD-10-CM

## 2018-05-09 DIAGNOSIS — Z880 Allergy status to penicillin: Secondary | ICD-10-CM | POA: Insufficient documentation

## 2018-05-09 DIAGNOSIS — N926 Irregular menstruation, unspecified: Secondary | ICD-10-CM | POA: Diagnosis not present

## 2018-05-09 DIAGNOSIS — N910 Primary amenorrhea: Secondary | ICD-10-CM | POA: Insufficient documentation

## 2018-05-09 DIAGNOSIS — N946 Dysmenorrhea, unspecified: Secondary | ICD-10-CM | POA: Insufficient documentation

## 2018-05-09 DIAGNOSIS — Z8759 Personal history of other complications of pregnancy, childbirth and the puerperium: Secondary | ICD-10-CM

## 2018-05-09 DIAGNOSIS — N939 Abnormal uterine and vaginal bleeding, unspecified: Secondary | ICD-10-CM | POA: Diagnosis present

## 2018-05-09 LAB — URINALYSIS, ROUTINE W REFLEX MICROSCOPIC
Bilirubin Urine: NEGATIVE
GLUCOSE, UA: NEGATIVE mg/dL
Ketones, ur: NEGATIVE mg/dL
Leukocytes, UA: NEGATIVE
Nitrite: NEGATIVE
PROTEIN: NEGATIVE mg/dL
RBC / HPF: >50 RBC/hpf — ABNORMAL HIGH (ref 0–5)
Specific Gravity, Urine: 1.031 — ABNORMAL HIGH (ref 1.005–1.030)
pH: 5 (ref 5.0–8.0)

## 2018-05-09 LAB — CBC
HCT: 44 % (ref 36.0–46.0)
HEMOGLOBIN: 15.1 g/dL — AB (ref 12.0–15.0)
MCH: 32.5 pg (ref 26.0–34.0)
MCHC: 34.3 g/dL (ref 30.0–36.0)
MCV: 94.6 fL (ref 78.0–100.0)
PLATELETS: 226 10*3/uL (ref 150–400)
RBC: 4.65 MIL/uL (ref 3.87–5.11)
RDW: 13 % (ref 11.5–15.5)
WBC: 6.9 10*3/uL (ref 4.0–10.5)

## 2018-05-09 LAB — POCT PREGNANCY, URINE: Preg Test, Ur: NEGATIVE

## 2018-05-09 LAB — HCG, QUANTITATIVE, PREGNANCY

## 2018-05-09 MED ORDER — KETOROLAC TROMETHAMINE 60 MG/2ML IM SOLN
60.0000 mg | Freq: Once | INTRAMUSCULAR | Status: AC
  Administered 2018-05-09: 60 mg via INTRAMUSCULAR
  Filled 2018-05-09: qty 2

## 2018-05-09 NOTE — MAU Provider Note (Signed)
History     CSN: [id number]  Arrival date and time: 05/09/18 1644   None     Chief Complaint  Patient presents with  . Vaginal Bleeding  . Abdominal Pain   HPI Latoya Ibarra is 37 y.o. [phone] presents for evaluation for vaginal bleeding and concern for possibly being pregnancy. A pregnancy is desired.  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 States her other tube is damaged.  States her period was due 4 days ago.  Had light spotting with cramping 2 days ago.  Today bleeding is heavier and has had more cramping described as pulling.  Rates pain as 10/10 at this time.  Took Ibuprofen  at 11 am this am.  Did not relieve pain. Has appt to see infertility specialist.    Past Medical History:  Diagnosis Date  . Anxiety   . Ectopic pregnancy    x2  . Headache   . Medical history non-contributory     Past Surgical History:  Procedure Laterality Date  . DILATION AND EVACUATION     ectopic pregnancy  . LAPAROSCOPY N/A 01/07/2015   Procedure: LAPAROSCOPY DIAGNOSTIC WITH REMOVAL OF ECTOPIC PREGNANCY;  Surgeon: Jaymes Graff, MD;  Location: [facility];  Service: Gynecology;  Laterality: N/A;  . LAPAROSCOPY Right 09/14/2015   Procedure: LAPAROSCOPY TREATMENT OF ECTOPIC PREGNANCY WITH SALPINGOSTOMY;  Surgeon: Silverio Lay, MD;  Location: [facility];  Service: Gynecology;  Laterality: Right;  with removal of ectopic pregnancy   . THERAPEUTIC ABORTION    . UNILATERAL SALPINGECTOMY Left 01/07/2015   Procedure: LEFT SALPINGECTOMY;  Surgeon: Jaymes Graff, MD;  Location: [facility];  Service: Gynecology;  Laterality: Left;    Family History  Problem Relation Age of Onset  . Diabetes Father     Social History   Tobacco Use  . Smoking status: Never Smoker  . Smokeless tobacco: Never Used  Substance Use Topics  . Alcohol use: No    Comment: occ wine  . Drug use: No    Allergies:  Allergies  Allergen Reactions  . Penicillins Hives    Has patient had a PCN  reaction causing immediate rash, facial/tongue/throat swelling, SOB or lightheadedness with hypotension: No Has patient had a PCN reaction causing severe rash involving mucus membranes or skin necrosis: No Has patient had a PCN reaction that required hospitalization No Has patient had a PCN reaction occurring within the last 10 years: No If all of the above answers are "NO", then may proceed with Cephalosporin use.      Medications Prior to Admission  Medication Sig Dispense Refill Last Dose  . Cetirizine HCl 10 MG CAPS Take 1 capsule (10 mg total) by mouth 2 (two) times daily for 5 days. 10 capsule 0   . zolpidem (AMBIEN) 10 MG tablet Take 10 mg by mouth at bedtime.   5 Past Week at Unknown time    Review of Systems  Constitutional: Negative for fever.  Respiratory: Negative for shortness of breath.   Cardiovascular: Negative for chest pain.  Gastrointestinal: Positive for abdominal pain (cramping).  Genitourinary: Positive for vaginal bleeding. Negative for dysuria, frequency and urgency.   Physical Exam   Blood pressure 125/73, pulse 72, temperature 98.4 F (36.9 C), temperature source Oral, resp. rate 16, weight 182 lb 8 oz (82.8 kg), last menstrual period 04/06/2018, SpO2 100 %.  Physical Exam  Constitutional: She is oriented to person, place, and time. She appears well-developed and well-nourished. No distress.  HENT:  Head: Normocephalic.  Neck: Normal range of motion.  Cardiovascular: Normal rate.  Respiratory: Effort normal.  GI: Soft. She exhibits no distension and no mass. There is no tenderness. There is no rebound and no guarding.  Genitourinary: There is no rash, tenderness or lesion on the right labia. There is no rash, tenderness or lesion on the left labia. Uterus is enlarged. Uterus is not tender. Cervix exhibits no motion tenderness, no discharge and no friability. Right adnexum displays no mass, no tenderness and no fullness. Left adnexum displays no mass, no  tenderness and no fullness. There is bleeding (small amount  of bright red bleeding.  Neg for clots) in the vagina. No tenderness in the vagina.  Genitourinary Comments: Small amount of bright red blood in vaginal canal without clots.  Neurological: She is alert and oriented to person, place, and time.  Skin: Skin is warm and dry.  Psychiatric: She has a normal mood and affect. Her behavior is normal. Thought content normal.    Results for orders placed or performed during the hospital encounter of 05/09/18 (from the past 24 hour(s))  Urinalysis, Routine w reflex microscopic     Status: Abnormal   Collection Time: 05/09/18  5:03 PM  Result Value Ref Range   Color, Urine YELLOW YELLOW   APPearance CLEAR CLEAR   Specific Gravity, Urine 1.031 (H) 1.005 - 1.030   pH 5.0 5.0 - 8.0   Glucose, UA NEGATIVE NEGATIVE mg/dL   Hgb urine dipstick LARGE (A) NEGATIVE   Bilirubin Urine NEGATIVE NEGATIVE   Ketones, ur NEGATIVE NEGATIVE mg/dL   Protein, ur NEGATIVE NEGATIVE mg/dL   Nitrite NEGATIVE NEGATIVE   Leukocytes, UA NEGATIVE NEGATIVE   RBC / HPF >50 (H) 0 - 5 RBC/hpf   WBC, UA 0-5 0 - 5 WBC/hpf   Bacteria, UA RARE (A) NONE SEEN   Squamous Epithelial / LPF 0-5 0 - 5   Mucus PRESENT   Pregnancy, urine POC     Status: None   Collection Time: 05/09/18  5:22 PM  Result Value Ref Range   Preg Test, Ur NEGATIVE NEGATIVE  CBC     Status: Abnormal   Collection Time: 05/09/18  5:58 PM  Result Value Ref Range   WBC 6.9 4.0 - 10.5 K/uL   RBC 4.65 3.87 - 5.11 MIL/uL   Hemoglobin 15.1 (H) 12.0 - 15.0 g/dL   HCT 65.7 84.6 - 96.2 %   MCV 94.6 78.0 - 100.0 fL   MCH 32.5 26.0 - 34.0 pg   MCHC 34.3 30.0 - 36.0 g/dL   RDW 95.2 84.1 - 32.4 %   Platelets 226 150 - 400 K/uL  hCG, quantitative, pregnancy     Status: None   Collection Time: 05/09/18  5:58 PM  Result Value Ref Range   hCG, Beta Chain, Quant, S <1 <5 mIU/mL   MAU Course  Procedures  MDM MSE Exam Labs Reported HPI, labs and plan of  care to Dr. Chestine Spore.  She approved giving Toradol for pain.   Medications: Toradol  IM given in MAU  Assessment and Plan  A:  Late menses      Dysmenorrhea      Negative UPT and BHCG <1      History of miscarriages X 3        P:  Instructed patient not to take any additional Ibuprofen until tomorrow      F/U with Dr. Chestine Spore as needed   Dennison Mascot Key 05/09/2018, 7:11 PM

## 2018-05-09 NOTE — Discharge Instructions (Signed)
Dysmenorrhea Dysmenorrhea means painful cramps during your period (menstrual period). You will have pain in your lower belly (abdomen). The pain is caused by the tightening (contracting) of the muscles of the womb (uterus). The pain may be mild or very bad. With this condition, you may:  Have a headache.  Feel sick to your stomach (nauseous).  Throw up (vomit).  Have lower back pain. Follow these instructions at home: Helping pain and cramping   Put heat on your lower back or belly when you have pain or cramps. Use the heat source that your doctor tells you to use.  Place a towel between your skin and the heat.  Leave the heat on for 20-30 minutes.  Remove the heat if your skin turns bright red. This is especially important if you cannot feel pain, heat, or cold.  Do not have a heating pad on during sleep.  Do aerobic exercises. These include walking, swimming, or biking. These may help with cramps.  Massage your lower back or belly. This may help lessen pain. General instructions   Take over-the-counter and prescription medicines only as told by your doctor.  Do not drive or use heavy machinery while taking prescription pain medicine.  Avoid alcohol and caffeine during and right before your period. These can make cramps worse.  Do not use any products that have nicotine or tobacco. These include cigarettes and e-cigarettes. If you need help quitting, ask your doctor.  Keep all follow-up visits as told by your doctor. This is important. Contact a doctor if:  You have pain that gets worse.  You have pain that does not get better with medicine.  You have pain during sex.  You feel sick to your stomach or you throw up during your period, and medicine does not help. Get help right away if:  You pass out (faint). Summary  Dysmenorrhea means painful cramps during your period (menstrual period).  Put heat on your lower back or belly when you have pain or cramps.  Do  exercises like walking, swimming, or biking to help with cramps.  Contact a doctor if you have pain during sex. This information is not intended to replace advice given to you by your health care provider. Make sure you discuss any questions you have with your health care provider. Document Released: 03/08/2009 Document Revised: 12/27/2016 Document Reviewed: 12/27/2016 Elsevier Interactive Patient Education  2017 [employer]  

## 2018-05-09 NOTE — MAU Note (Addendum)
Supposed to start period 4 days ago. On Wed, had light bleeding/ spotting, with consistent pain.  Today the bleeding picked up a little but the pain is worse.  Has had several miscarriages, has been trying.  Has not done a preg test

## 2018-08-18 ENCOUNTER — Ambulatory Visit (HOSPITAL_COMMUNITY)
Admission: EM | Admit: 2018-08-18 | Discharge: 2018-08-18 | Disposition: A | Discharge status: Home / Self Care | Payer: 59

## 2018-08-18 NOTE — ED Notes (Signed)
Patient called x 2 with no response. 

## 2018-08-18 NOTE — ED Notes (Signed)
Staff reported patient was called several times and no answer.

## 2018-09-26 ENCOUNTER — Emergency Department (HOSPITAL_COMMUNITY): Payer: 59

## 2018-09-26 ENCOUNTER — Encounter (HOSPITAL_COMMUNITY): Payer: Self-pay | Admitting: Nurse Practitioner

## 2018-09-26 DIAGNOSIS — R0789 Other chest pain: Secondary | ICD-10-CM | POA: Insufficient documentation

## 2018-09-26 DIAGNOSIS — R1084 Generalized abdominal pain: Secondary | ICD-10-CM | POA: Insufficient documentation

## 2018-09-26 DIAGNOSIS — R0981 Nasal congestion: Secondary | ICD-10-CM | POA: Insufficient documentation

## 2018-09-26 DIAGNOSIS — Z5321 Procedure and treatment not carried out due to patient leaving prior to being seen by health care provider: Secondary | ICD-10-CM | POA: Insufficient documentation

## 2018-09-26 DIAGNOSIS — N939 Abnormal uterine and vaginal bleeding, unspecified: Secondary | ICD-10-CM | POA: Insufficient documentation

## 2018-09-26 DIAGNOSIS — R51 Headache: Secondary | ICD-10-CM | POA: Insufficient documentation

## 2018-09-26 LAB — BASIC METABOLIC PANEL
Anion gap: 13 (ref 5–15)
BUN: 13 mg/dL (ref 6–20)
CALCIUM: 8.9 mg/dL (ref 8.9–10.3)
CO2: 19 mmol/L — ABNORMAL LOW (ref 22–32)
Chloride: 108 mmol/L (ref 98–111)
Creatinine, Ser: 0.86 mg/dL (ref 0.44–1.00)
GFR calc Af Amer: >60 mL/min (ref >60)
GLUCOSE: 103 mg/dL — AB (ref 70–99)
Potassium: 3.8 mmol/L (ref 3.5–5.1)
Sodium: 140 mmol/L (ref 135–145)

## 2018-09-26 LAB — CBC
HCT: 45 % (ref 36.0–46.0)
Hemoglobin: 15.1 g/dL — ABNORMAL HIGH (ref 12.0–15.0)
MCH: 32 pg (ref 26.0–34.0)
MCHC: 33.6 g/dL (ref 30.0–36.0)
MCV: 95.3 fL (ref 78.0–100.0)
Platelets: 156 10*3/uL (ref 150–400)
RBC: 4.72 MIL/uL (ref 3.87–5.11)
RDW: 12.9 % (ref 11.5–15.5)
WBC: 4.5 10*3/uL (ref 4.0–10.5)

## 2018-09-26 LAB — I-STAT BETA HCG BLOOD, ED (NOT ORDERABLE): I-stat hCG, quantitative: <5 m[IU]/mL (ref <5)

## 2018-09-26 LAB — POCT I-STAT TROPONIN I: Troponin i, poc: 0 ng/mL (ref 0.00–0.08)

## 2018-09-26 NOTE — ED Triage Notes (Signed)
Pt is c/o chest pain, URI symptoms of nasal/facial congestion, headache, runny nose and also adds that's she is having severe abdominal cramping with associated vaginal bleeding.

## 2018-09-27 ENCOUNTER — Emergency Department (HOSPITAL_COMMUNITY)
Admission: EM | Admit: 2018-09-27 | Discharge: 2018-09-27 | Disposition: A | Discharge status: Home / Self Care | Payer: 59 | Attending: Emergency Medicine | Admitting: Emergency Medicine

## 2018-09-27 NOTE — ED Notes (Signed)
Pt called to be roomed, no answer for the third time

## 2018-09-27 NOTE — ED Notes (Signed)
Pt called to roomed, no answer for 2nd time

## 2019-01-15 ENCOUNTER — Other Ambulatory Visit (HOSPITAL_COMMUNITY)
Admission: RE | Admit: 2019-01-15 | Discharge: 2019-01-15 | Disposition: A | Discharge status: Home / Self Care | Payer: 59 | Source: Ambulatory Visit | Attending: Nurse Practitioner | Admitting: Nurse Practitioner

## 2019-01-15 ENCOUNTER — Other Ambulatory Visit: Payer: Self-pay | Admitting: Nurse Practitioner

## 2019-01-15 DIAGNOSIS — Z01419 Encounter for gynecological examination (general) (routine) without abnormal findings: Secondary | ICD-10-CM | POA: Insufficient documentation

## 2019-01-19 LAB — CYTOLOGY - PAP
DIAGNOSIS: NEGATIVE
HPV: NOT DETECTED

## 2019-08-06 DIAGNOSIS — G43909 Migraine, unspecified, not intractable, without status migrainosus: Secondary | ICD-10-CM | POA: Diagnosis not present

## 2019-08-06 DIAGNOSIS — Z6832 Body mass index (BMI) 32.0-32.9, adult: Secondary | ICD-10-CM | POA: Diagnosis not present

## 2019-08-06 DIAGNOSIS — E559 Vitamin D deficiency, unspecified: Secondary | ICD-10-CM | POA: Diagnosis not present

## 2019-08-06 DIAGNOSIS — F419 Anxiety disorder, unspecified: Secondary | ICD-10-CM | POA: Diagnosis not present

## 2019-08-06 DIAGNOSIS — E668 Other obesity: Secondary | ICD-10-CM | POA: Diagnosis not present

## 2021-06-06 ENCOUNTER — Other Ambulatory Visit: Payer: Self-pay | Admitting: Internal Medicine

## 2021-06-07 LAB — COMPLETE METABOLIC PANEL WITH GFR
AG Ratio: 1.8 (calc) (ref 1.0–2.5)
ALT: 16 U/L (ref 6–29)
AST: 18 U/L (ref 10–30)
Albumin: 4.2 g/dL (ref 3.6–5.1)
Alkaline phosphatase (APISO): 69 U/L (ref 31–125)
BUN: 14 mg/dL (ref 7–25)
CO2: 23 mmol/L (ref 20–32)
Calcium: 9 mg/dL (ref 8.6–10.2)
Chloride: 105 mmol/L (ref 98–110)
Creat: 0.82 mg/dL (ref 0.50–1.10)
GFR, Est African American: 105 mL/min/{1.73_m2} (ref >=60)
GFR, Est Non African American: 91 mL/min/{1.73_m2} (ref >=60)
Globulin: 2.4 g/dL (calc) (ref 1.9–3.7)
Glucose, Bld: 104 mg/dL — ABNORMAL HIGH (ref 65–99)
Potassium: 3.7 mmol/L (ref 3.5–5.3)
Sodium: 140 mmol/L (ref 135–146)
Total Bilirubin: 0.9 mg/dL (ref 0.2–1.2)
Total Protein: 6.6 g/dL (ref 6.1–8.1)

## 2021-06-07 LAB — CBC
HCT: 42.9 % (ref 35.0–45.0)
Hemoglobin: 14.8 g/dL (ref 11.7–15.5)
MCH: 31.8 pg (ref 27.0–33.0)
MCHC: 34.5 g/dL (ref 32.0–36.0)
MCV: 92.1 fL (ref 80.0–100.0)
MPV: 9.1 fL (ref 7.5–12.5)
Platelets: 310 10*3/uL (ref 140–400)
RBC: 4.66 10*6/uL (ref 3.80–5.10)
RDW: 13.1 % (ref 11.0–15.0)
WBC: 6.5 10*3/uL (ref 3.8–10.8)

## 2021-06-07 LAB — LIPID PANEL
Cholesterol: 214 mg/dL — ABNORMAL HIGH (ref <200)
HDL: 59 mg/dL (ref >=50)
LDL Cholesterol (Calc): 133 mg/dL (calc) — ABNORMAL HIGH
Non-HDL Cholesterol (Calc): 155 mg/dL (calc) — ABNORMAL HIGH (ref <130)
Total CHOL/HDL Ratio: 3.6 (calc) (ref <5.0)
Triglycerides: 117 mg/dL (ref <150)

## 2021-06-07 LAB — TSH: TSH: 1.78 mIU/L

## 2021-06-07 LAB — VITAMIN D 25 HYDROXY (VIT D DEFICIENCY, FRACTURES): Vit D, 25-Hydroxy: 28 ng/mL — ABNORMAL LOW (ref 30–100)

## 2022-02-21 ENCOUNTER — Other Ambulatory Visit: Payer: Self-pay | Admitting: Internal Medicine

## 2022-02-21 LAB — LIPID PANEL
Cholesterol: 204 mg/dL — ABNORMAL HIGH (ref <200)
HDL: 52 mg/dL (ref >=50)
LDL Cholesterol (Calc): 129 mg/dL (calc) — ABNORMAL HIGH
Non-HDL Cholesterol (Calc): 152 mg/dL (calc) — ABNORMAL HIGH (ref <130)
Total CHOL/HDL Ratio: 3.9 (calc) (ref <5.0)
Triglycerides: 124 mg/dL (ref <150)

## 2022-02-21 LAB — COMPLETE METABOLIC PANEL WITH GFR
AG Ratio: 1.9 (calc) (ref 1.0–2.5)
ALT: 13 U/L (ref 6–29)
AST: 17 U/L (ref 10–30)
Albumin: 4.3 g/dL (ref 3.6–5.1)
Alkaline phosphatase (APISO): 68 U/L (ref 31–125)
BUN: 12 mg/dL (ref 7–25)
CO2: 22 mmol/L (ref 20–32)
Calcium: 9.3 mg/dL (ref 8.6–10.2)
Chloride: 105 mmol/L (ref 98–110)
Creat: 0.94 mg/dL (ref 0.50–0.97)
Globulin: 2.3 g/dL (calc) (ref 1.9–3.7)
Glucose, Bld: 83 mg/dL (ref 65–99)
Potassium: 4 mmol/L (ref 3.5–5.3)
Sodium: 138 mmol/L (ref 135–146)
Total Bilirubin: 0.8 mg/dL (ref 0.2–1.2)
Total Protein: 6.6 g/dL (ref 6.1–8.1)
eGFR: 79 mL/min/{1.73_m2} (ref >=60)

## 2022-02-21 LAB — VITAMIN D 25 HYDROXY (VIT D DEFICIENCY, FRACTURES): Vit D, 25-Hydroxy: 27 ng/mL — ABNORMAL LOW (ref 30–100)

## 2022-02-21 LAB — CBC
HCT: 42.8 % (ref 35.0–45.0)
Hemoglobin: 14.7 g/dL (ref 11.7–15.5)
MCH: 31.2 pg (ref 27.0–33.0)
MCHC: 34.3 g/dL (ref 32.0–36.0)
MCV: 90.9 fL (ref 80.0–100.0)
MPV: 9.4 fL (ref 7.5–12.5)
Platelets: 283 10*3/uL (ref 140–400)
RBC: 4.71 10*6/uL (ref 3.80–5.10)
RDW: 13 % (ref 11.0–15.0)
WBC: 7.3 10*3/uL (ref 3.8–10.8)

## 2022-02-21 LAB — TSH: TSH: 1.42 mIU/L

## 2022-05-23 ENCOUNTER — Emergency Department (HOSPITAL_COMMUNITY)
Admission: EM | Admit: 2022-05-23 | Discharge: 2022-05-23 | Disposition: A | Discharge status: Home / Self Care | Payer: 59 | Attending: Emergency Medicine | Admitting: Emergency Medicine

## 2022-05-23 ENCOUNTER — Encounter (HOSPITAL_COMMUNITY): Payer: Self-pay | Admitting: Emergency Medicine

## 2022-05-23 ENCOUNTER — Emergency Department (HOSPITAL_COMMUNITY): Payer: 59

## 2022-05-23 ENCOUNTER — Other Ambulatory Visit: Payer: Self-pay

## 2022-05-23 DIAGNOSIS — U071 COVID-19: Secondary | ICD-10-CM | POA: Insufficient documentation

## 2022-05-23 DIAGNOSIS — R0602 Shortness of breath: Secondary | ICD-10-CM | POA: Diagnosis present

## 2022-05-23 MED ORDER — BENZONATATE 100 MG PO CAPS: 100.0000 mg | ORAL_CAPSULE | Freq: Three times a day (TID) | ORAL | 0 refills | Status: AC

## 2022-05-23 MED ORDER — ONDANSETRON 4 MG PO TBDP: ORAL_TABLET | ORAL | 0 refills | Status: AC

## 2022-05-23 NOTE — ED Triage Notes (Signed)
Pt reports testing pos for COVID on Tuesday. Pt reports SHOB x2 days ago. Pt reports chest tightness as well. Pt reports O2 levels dropping below 90s.

## 2022-05-23 NOTE — ED Notes (Signed)
Pt. Ambulated around the unit with handelheld O2. Pt. Tolerated walk well, vitals stable. Will continue to monitor

## 2022-05-23 NOTE — Discharge Instructions (Addendum)
Your chest x-ray does not show a pneumonia.  You were able to walk around here without requiring oxygen.  You are experiencing symptoms unfortunately that are typical of COVID-19 infection.  This usually will last for at least a couple weeks and then will get progressively better.  The medicine that you are on is not meant to make your symptoms better but was thought to prevent you from coming into the hospital.  Please return for worsening difficulty breathing.  Please follow-up with your family doctor in the office.

## 2022-05-23 NOTE — ED Provider Notes (Signed)
[facility]-EMERGENCY DEPT Provider Note   CSN: [id number] Arrival date & time: 05/23/22  1015     History  Chief Complaint  Patient presents with   Shortness of Breath    Latoya Ibarra is a 41 y.o. female.  41 yo F with a cc of sob.  Patient was diagnosed with COVID about 6 days ago.  Has been having symptoms little bit longer than that.  She tells me that she has been having a lot of trouble breathing.  Cough congestion and some loose stools.  Has now been seen 3 times previously for this is on Paxlovid currently is done a course of steroids.  She had talked with a friend that told her she needed to come to the ED immediately because she was still having trouble breathing.   Shortness of Breath     Home Medications Prior to Admission medications   Medication Sig Start Date End Date Taking? Authorizing Provider  benzonatate (TESSALON) 100 MG capsule Take 1 capsule (100 mg total) by mouth every 8 (eight) hours. 05/23/22  Yes Melene Plan, DO  ondansetron (ZOFRAN-ODT) 4 MG disintegrating tablet 4mg  ODT q4 hours prn nausea/vomit 05/23/22  Yes 05/25/22, DO  Cetirizine HCl 10 MG CAPS Take 1 capsule (10 mg total) by mouth 2 (two) times daily for 5 days. 04/08/18 04/13/18  Wieters, Hallie C, PA-C  zolpidem (AMBIEN) 10 MG tablet Take 10 mg by mouth at bedtime.  06/04/16   [provider]      Allergies    Penicillins    Review of Systems   Review of Systems  Respiratory:  Positive for shortness of breath.    Physical Exam Updated Vital Signs BP (!) 155/95   Pulse 75   Temp (!) 97.3 F (36.3 C) (Oral)   Resp 17   SpO2 96%  Physical Exam Vitals and nursing note reviewed.  Constitutional:      General: She is not in acute distress.    Appearance: She is well-developed. She is not diaphoretic.  HENT:     Head: Normocephalic and atraumatic.  Eyes:     Pupils: Pupils are equal, round, and reactive to light.  Cardiovascular:     Rate and  Rhythm: Normal rate and regular rhythm.     Heart sounds: No murmur heard.   No friction rub. No gallop.  Pulmonary:     Effort: Pulmonary effort is normal.     Breath sounds: No decreased breath sounds, wheezing or rales.  Abdominal:     General: There is no distension.     Palpations: Abdomen is soft.     Tenderness: There is no abdominal tenderness.  Musculoskeletal:        General: No tenderness.     Cervical back: Normal range of motion and neck supple.  Skin:    General: Skin is warm and dry.  Neurological:     Mental Status: She is alert and oriented to person, place, and time.  Psychiatric:        Behavior: Behavior normal.    ED Results / Procedures / Treatments   Labs (all labs ordered are listed, but only abnormal results are displayed) Labs Reviewed - No data to display  EKG None  Radiology DG Chest [facility] 1 View  Result Date: 05/23/2022 CLINICAL DATA:  Cough, shortness of breath EXAM: PORTABLE CHEST 1 VIEW COMPARISON:  2019 FINDINGS: The heart size and mediastinal contours are within normal limits. Both lungs are  clear. No pleural effusion. No pneumothorax. The visualized skeletal structures are unremarkable. IMPRESSION: No active disease. Electronically Signed   By: Guadlupe Spanish M.D.   On: 05/23/2022 10:54    Procedures Procedures    Medications Ordered in ED Medications - No data to display  ED Course/ Medical Decision Making/ A&P                           Medical Decision Making Amount and/or Complexity of Data Reviewed Radiology: ordered.  Risk Prescription drug management.   41 yo F with a chief complaints of URI-like symptoms and difficulty breathing.  The patient unfortunately has been diagnosed with COVID.  I think that explains all of her symptoms and she is following a typical course of this illness.  She is upset because there is nothing is making the breathing any better.  She has tried steroids and breathing treatments and is currently on  Paxlovid.  I discussed with her that this typically is something that her body will have to get rid of on its own.  There is no treatment that we have come up with in the hospital setting that tends to immediately eradicate her symptoms.  I discussed the low utility of x-ray imaging but will obtain an x-ray today.  We will have her ambulate with pulse ox.  Patient ambulated without oxygen dropping below 95.  Will discharge home.  PCP follow-up.  12:04 PM:  I have discussed the diagnosis/risks/treatment options with the patient.  Evaluation and diagnostic testing in the emergency department does not suggest an emergent condition requiring admission or immediate intervention beyond what has been performed at this time.  They will follow up with  PCP. We also discussed returning to the ED immediately if new or worsening sx occur. We discussed the sx which are most concerning (e.g., sudden worsening pain, fever, inability to tolerate by mouth) that necessitate immediate return. Medications administered to the patient during their visit and any new prescriptions provided to the patient are listed below.  Medications given during this visit Medications - No data to display   The patient appears reasonably screen and/or stabilized for discharge and I doubt any other medical condition or other [facility] requiring further screening, evaluation, or treatment in the ED at this time prior to discharge.          Final Clinical Impression(s) / ED Diagnoses Final diagnoses:  COVID-19 virus infection    Rx / DC Orders ED Discharge Orders          Ordered    benzonatate (TESSALON) 100 MG capsule  Every 8 hours        05/23/22 1203    ondansetron (ZOFRAN-ODT) 4 MG disintegrating tablet        05/23/22 1203              Melene Plan, DO 05/23/22 1204

## 2022-11-21 DIAGNOSIS — R5383 Other fatigue: Secondary | ICD-10-CM | POA: Diagnosis not present

## 2022-11-21 DIAGNOSIS — R6882 Decreased libido: Secondary | ICD-10-CM | POA: Diagnosis not present

## 2023-01-03 DIAGNOSIS — E668 Other obesity: Secondary | ICD-10-CM | POA: Diagnosis not present

## 2023-01-03 DIAGNOSIS — F419 Anxiety disorder, unspecified: Secondary | ICD-10-CM | POA: Diagnosis not present

## 2023-02-09 DIAGNOSIS — Z1231 Encounter for screening mammogram for malignant neoplasm of breast: Secondary | ICD-10-CM | POA: Diagnosis not present

## 2023-02-21 DIAGNOSIS — R928 Other abnormal and inconclusive findings on diagnostic imaging of breast: Secondary | ICD-10-CM | POA: Diagnosis not present

## 2023-02-21 DIAGNOSIS — R922 Inconclusive mammogram: Secondary | ICD-10-CM | POA: Diagnosis not present
